# Patient Record
Sex: Female | Born: 1945 | Hispanic: Yes | Smoking: Former smoker
Health system: Southern US, Community
[De-identification: ages and names within clinical notes are randomized; demographics above are authoritative.]

## PROBLEM LIST (undated history)

## (undated) DIAGNOSIS — I509 Heart failure, unspecified: Secondary | ICD-10-CM

## (undated) DIAGNOSIS — I10 Essential (primary) hypertension: Secondary | ICD-10-CM

## (undated) DIAGNOSIS — M1990 Unspecified osteoarthritis, unspecified site: Secondary | ICD-10-CM

## (undated) DIAGNOSIS — E119 Type 2 diabetes mellitus without complications: Secondary | ICD-10-CM

## (undated) DIAGNOSIS — I2510 Atherosclerotic heart disease of native coronary artery without angina pectoris: Secondary | ICD-10-CM

## (undated) DIAGNOSIS — H539 Unspecified visual disturbance: Secondary | ICD-10-CM

## (undated) DIAGNOSIS — D649 Anemia, unspecified: Secondary | ICD-10-CM

## (undated) DIAGNOSIS — H269 Unspecified cataract: Secondary | ICD-10-CM

## (undated) DIAGNOSIS — K219 Gastro-esophageal reflux disease without esophagitis: Secondary | ICD-10-CM

## (undated) DIAGNOSIS — I350 Nonrheumatic aortic (valve) stenosis: Secondary | ICD-10-CM

## (undated) DIAGNOSIS — E7800 Pure hypercholesterolemia, unspecified: Secondary | ICD-10-CM

## (undated) DIAGNOSIS — Z9189 Other specified personal risk factors, not elsewhere classified: Secondary | ICD-10-CM

## (undated) HISTORY — DX: Nonrheumatic aortic (valve) stenosis: I35.0

## (undated) HISTORY — DX: Essential (primary) hypertension: I10

## (undated) HISTORY — DX: Pure hypercholesterolemia, unspecified: E78.00

## (undated) HISTORY — DX: Unspecified osteoarthritis, unspecified site: M19.90

## (undated) HISTORY — DX: Atherosclerotic heart disease of native coronary artery without angina pectoris: I25.10

## (undated) HISTORY — DX: Type 2 diabetes mellitus without complications: E11.9

## (undated) HISTORY — DX: Heart failure, unspecified: I50.9

---

## 2017-07-12 ENCOUNTER — Telehealth: Payer: Self-pay

## 2017-07-12 NOTE — Telephone Encounter (Signed)
Received call from Patient's daughter stating her mother was referred to have a TAVR and she had not been contacted. [facility] Clinic had not received referral; no MR located.      Patient was seen by Dr. Aneta Mins and Dr. Baltazar Apo at [facility].    Called [place] [facility] and spoke with [place]. Requested records. Marcelle Smiling stated records had already been faxed for referral; fax number given was not for [facility].    Received fax of last OV note from Dr. Aneta Mins (05/27/17) which states, "s/p cardiac cath on 05/20/2017 patient was referred to Valve Clinic at discharge."   Spoke with Mindi Junker at [facility] - requested additional records to include echocardiogram, labs, cardiac catheterization report, as well as  patient demographic information.   Medical records requested from [facility] and B. Sales, MD, patient's PCP, for reports/images as indicated for TAVR evaluation.    Called and updated daughter. Advised an [facility] will call to review additional information and coordinate appointments.  Reviewed [facility]Valve Clinic contact information. Daughter verbalized understanding.

## 2017-07-13 ENCOUNTER — Other Ambulatory Visit: Payer: Self-pay

## 2017-07-15 ENCOUNTER — Other Ambulatory Visit: Payer: Self-pay

## 2017-07-15 DIAGNOSIS — I350 Nonrheumatic aortic (valve) stenosis: Secondary | ICD-10-CM

## 2017-07-26 ENCOUNTER — Ambulatory Visit
Admission: RE | Admit: 2017-07-26 | Discharge: 2017-07-26 | Disposition: A | Discharge status: Home / Self Care | Payer: Self-pay | Source: Ambulatory Visit

## 2017-07-26 ENCOUNTER — Other Ambulatory Visit: Payer: Self-pay

## 2017-08-02 ENCOUNTER — Encounter: Payer: Self-pay | Admitting: Surgery

## 2017-08-02 ENCOUNTER — Other Ambulatory Visit: Payer: Self-pay

## 2017-08-02 ENCOUNTER — Telehealth: Payer: Self-pay

## 2017-08-02 NOTE — Telephone Encounter (Signed)
Spoke with Patient's daughter. Confirmed appointment scheduled 08/03/2017 in [facility].

## 2017-08-02 NOTE — Progress Notes (Addendum)
[place] STRUCTURAL HEART CONSULT    Date & Time: 08/03/17  1500  Patient Name: Ashley Mason  Encounter Physician: Alycia Rossetti  Referring Cardiologist: Mafi/ Karsten Ro    Reason for Consultation:   Cardiac surgery consult for TAVR    History:   71 yo female with severe symptomatic AS. She was seen in office by Dr. Karsten Ro on 08/02/17 and referred for surgical consultation for TAVR.  She underwent cardiac cath in July after she presented with chest pain and cath showed nonobstructive CAD and severe AS.  Post cardiac cath she had some visual deficits in her right eye that have persisted.   She lives with her daughter and has symptoms of fatigue, DOE and lightheadedness with exertion.  Her activity level is also somewhat limited by knee pain. She has a left knee replacement and chronic pain in the right knee.    NYHA: Functional Capacity-Class II  Angina I    Echocardiogram  04/30/2017: EF 65%.  Aortic valve peak flow velocity is 4.45 m/s and mean pressure gradient 49.01 mmHg. AVA 0.64 cm2 . Trace mitral regurgitation. Trace tricuspid regurgitation.     Carotid Artery Duplex  07/27/2017: The common carotid arteries appear normal bilaterally. 1-49% stenosis seen in the internal carotid arteries bilaterally. Insignificant stenosis seen in the external carotid arteries bilaterally. Antegrade flow seen in the vertebral arteries bilaterally. The subclavian arteries are within normal limits.    Cardiac Cath: 05/20/17  [facility]   Lv gram not performed,  AVA 0.5 cm2,  PA 35/16,  LAD mid 50% no other significant stenosis.    Renal Function:  Creat 0.5   05/13/17  [facility]    PFT: needs to be scheduled.    STS Risk Score:   Mortality:1.7%   Morbidity: 12.12%    5-meter walk- secs   No Assistive devices used  1st-6.83  2nd-7.05  3rd-8.66  Pt noticeably more fatigued after 3rd walk. Held onto wall for stability while we talked.    Past Medical History:     Past Medical History:   Diagnosis Date   . Aortic stenosis     Echo 06/2017 AVA .064 MG 49   .  Arthritis     knees- takes occasional ibuprophen or Tylenol   . CAD (coronary artery disease)     50% LAD-lesion cath 05/2017   . CHF (congestive heart failure)    . Diabetes type 2, controlled     Glucophage   . HTN (hypertension)     Diovan   . Hypercholesteremia     Lipitor       Past Surgical History:   Procedure Laterality Date   . CARDIAC CATHETERIZATION  05/2017   . COLON RESECTION  1984   . TOTAL KNEE ARTHROPLASTY Left 2017       Family History:   No family history on file.    Social History:     Social History     Social History   . Marital status: Widowed     Spouse name: N/A   . Number of children: N/A   . Years of education: N/A     Social History Main Topics   . Smoking status: Former Games developer   . Smokeless tobacco: Never Used   . Alcohol use None   . Drug use: Unknown   . Sexual activity: Not Asked     Other Topics Concern   . None     Social History Narrative   . None  Allergies:     Allergies   Allergen Reactions   . Sulfa Antibiotics Hives       Medications:   Home Meds:  Current/Home Medications    ACETAMINOPHEN (TYLENOL) 500 MG TABLET    Take 500 mg by mouth.Arthritis in knees    ASPIRIN 81 MG CHEWABLE TABLET    Take 2 Tabs by Mouth Once a Day.    ATORVASTATIN (LIPITOR) 80 MG TABLET    Take 1 Tab by Mouth Once a Day.    CALCIUM PO    Take 1,200 mg by mouth daily.        LOSARTAN (COZAAR) 50 MG TABLET    take 1 tablet by mouth once daily    METFORMIN (GLUCOPHAGE) 500 MG TABLET    Take 1 Tab by Mouth Twice Daily. Resume Sunday!       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 Has loose tooth  on upper right molar- possible infection, no recent dental care.  Heme: denies hx blood transfusions, or DVT's  MS:  Knee pain bilat, had left replacement still with pain     Physical Exam:   Wt 171 lbs  77.6 kg,  Ht 5'1"  154.9  Cm,   Visit Vitals  BP 122/77 (BP Site: Right arm, Patient Position: Sitting)   Pulse 69   Temp 98.3 F (36.8 C) (Oral)   Resp 20   Ht 1.549 m (5\' 1" )   Wt 78.9 kg (174 lb)   SpO2 98%   BMI 32.88 kg/m     General Appearance: WDWN female nad  HEENT:normocephalic, mucous membranes moist and pink, conjunctiva and sclera clear non-icteric, PERRLA, EOMI  Neck:  Supple, carotid 2+=, +transmitted murmur  Chest:clear bilat  Cardiovascular: RRR S1S2 +systolic murmur 3/6  Abdomen: soft nontender  Extremities:no edema , pedal pulses +  Skin: warm and dry no rashes  Musculoskeletal: fROM, no deformity, slow walk  Neuro: a/o c 3 MAE, grossly intact.    Cardiographics:   ekg 08/03/17 NORMAL SINUS RHYTHM  LEFT VENTRICULAR HYPERTROPHY         Labs Reviewed:                             Rads:   Xr Chest 2 Views    Result Date: 08/03/2017    Mild cardiomegaly, hypoventilatory change. Geanie Cooley, MD 08/03/2017 2:49 PM      Assessment:   1. Nonrheumatic aortic valve stenosis    2. 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  Needs PFT  Re-do 5 meter walk- at next visit    Pt was seen by DR. Jamey Ripa, NP  Structural Heart, [facility] Valve Program  Spect  [id number]  Fax [phone]  Office [phone]

## 2017-08-03 ENCOUNTER — Ambulatory Visit
Admission: RE | Admit: 2017-08-03 | Discharge: 2017-08-03 | Disposition: A | Discharge status: Home / Self Care | Payer: Medicare Other | Source: Ambulatory Visit | Attending: Adult Health | Admitting: Adult Health

## 2017-08-03 ENCOUNTER — Ambulatory Visit: Payer: Medicare Other | Admitting: Surgery

## 2017-08-03 VITALS — BP 122/77 | HR 69 | Temp 98.3°F | Resp 20 | Ht 61.0 in | Wt 174.0 lb

## 2017-08-03 DIAGNOSIS — R0689 Other abnormalities of breathing: Secondary | ICD-10-CM | POA: Insufficient documentation

## 2017-08-03 DIAGNOSIS — I517 Cardiomegaly: Secondary | ICD-10-CM | POA: Insufficient documentation

## 2017-08-03 DIAGNOSIS — I350 Nonrheumatic aortic (valve) stenosis: Secondary | ICD-10-CM

## 2017-08-03 DIAGNOSIS — I5032 Chronic diastolic (congestive) heart failure: Secondary | ICD-10-CM

## 2017-08-03 DIAGNOSIS — Z01818 Encounter for other preprocedural examination: Secondary | ICD-10-CM | POA: Insufficient documentation

## 2017-08-03 LAB — ECG 12-LEAD
Atrial Rate: 68 {beats}/min
P Axis: 15 degrees
P-R Interval: 168 ms
Q-T Interval: 394 ms
QRS Duration: 92 ms
QTC Calculation (Bezet): 418 ms
R Axis: -23 degrees
T Axis: 140 degrees
Ventricular Rate: 68 {beats}/min

## 2017-08-03 NOTE — Progress Notes (Signed)
Pt seen with  daughter  for RN/NP TAVR Teaching and Testing.  Information Packet with contact numbers given & Reviewed.    Pt seen by Dr Alycia Rossetti  for TAVR Surgeon consult  KCCQ-12-done    Hand Strength Grip Test-  Average of three attempts   R Hand Dominant--27.0       5-meter walk- secs   No Assistive devices used  1st-6.83  2nd-7.05  3rd-8.66  Pt noticeably more fatigued after 3rd walk. Held onto wall for stability while we talked.    Reviewed remaining tests needed  Provided dental form   Daughter, Charlotte Sanes, will call with date of CT scan once it is scheduled  Copies of AVS given and reviewed with pt   Understanding verbalized.

## 2017-08-03 NOTE — Patient Instructions (Addendum)
Medical Emergencies --Call 911   Structural Heart Program evaluation consists of the following   -NP & RN visit for teaching, assessment, & chart review  -Cardiac Catheterization  -Echocardiogram   -EKG  -Chest Xray  -Carotid Ultrasound-   -Pulmonary Function Test  CTA Chest,  Abdomen, & Pelvis - @ [facility])    Structural Heart Program Visits @ [facility]      You will  have a consult with one TAVR Cardiologist listed below.                Dr. Tawana Scale will  have consults with two TAVR Surgeon's as listed below.                 Dr. Garen Grams                 Dr. Alycia Rossetti       To schedule call [phone] the CT  [address] [place], [place] [address].    Date       Arrival Time      Test Time        - No solid food 2 hours prior to CTA        - Drink clear liquids up to 30 min prior to CTA.                - Do not take Medications containing Metformin AM of CTA.        - Follow instructions from [facility] for post CTA care.        - Bring insurance card & photo ID.         - Bring list of medications.      Please bring the dental form with you to your next visit- or ask dentist to fax the Structural Heart Clinic        Pre Procedure Lab work is performed @ [facility]   [address] [place], [place] [address], Phone # [phone]     Hours of Operation M-F 7am-7Pm.  Saturdays 8-3:45. Closed Sundays & Holidays  This is a walk in service,  & No fasting required.   Lab work appointment will consist of blood & urine samples & possibly an Ekg.   Please allow approximately 1 hour for this appointment      [place] Pre[facility] RN Phone Interview Date & Time      You will be contacted by [facility] Pre-surgical Services to schedule above.  Please allow approximately 1 hour for this appointment.      Procedure Date        Arrival Time - See Below   Our Staff will call you the day prior by 5 pm with your arrival time.   Procedural arrival times are 0500,  0700, 0900, or 1100.   Please register @ Cardiac Cath/EP lab/Cardiac surgery check in.   Ground floor IHVI the day of your procedure.   Please follow the medication list printed for you today.  Please obtain Hibiclens @ [place] [facility] lab visit & follow the instructions for use.   Please fill Bactroban Prescription & follow the instructions as listed below.  Place a Pea sized amt on a Q-tip & apply to the inside of each nostril  Press the sides of the nose together & gently massage after application to spread the ointment throughout the inside of the nostrils.  Structural Heart Program Visits Post Procedure  Structural Heart Program 3-5 day Post Discharge Visit   This appointment will consist of a Nurse Coordinator & Practitioner visit.   Chest Xray, EKG, & lab work may be performed @ this visit.  No fasting or special preparation is needed for this visit.  Report to Cardiac Diagnostics department - 1st floor [facility] for EKG/CXR.  Proceed to Structural Heart Program Ground floor IHVI after above is complete.    Structural Heart Program-- 30 day Post Procedure Visit  Your 30 day post Procedure Range is    This appointment will consist of a Nurse Coordinator & Practitioner visit  Echocardiogram, Ekg, Non- fasting Lab work, & a questionnaire.    Please allow approximately 2 hours for this appointment.    Structural Heart Program --1 year Post Procedure Visit     Your 1 year post Procedure Range is   This appointment will consist of a Nurse Coordinator & Practitioner visit  Echocardiogram, Ekg, Non- fasting Lab work, & a questionnaire.    Please allow approximately 2 hours for this appointment.     For Clinical questions, please call Mon-Fri 9am to 4pm.   Nolon Nations - Nurse Coordinator @ [phone].  Mitzi Hansen - Nurse Coordinator @ [phone]  Remi Haggard - Nurse Coordinator @ [phone]     For Scheduling / Financial questions, please call Mon-Fri 9am to 4pm  Sander Radon - Administrative Coordinator @  [phone]  Janna Arch - Administrative Coordinator @ [phone]

## 2017-08-06 ENCOUNTER — Telehealth: Payer: Self-pay

## 2017-08-06 NOTE — Telephone Encounter (Signed)
Spoke with Pt's daughter.  CT scheduled for Thursday, 08/12/17 @ 0945.  She is scheduling appt for dentist 1st week in October after she sees PCP.      SH/Valve Clinic will call Monday to discuss appt for 2nd surgeon consult and PFT.    Daughter verbalized understanding.

## 2017-08-09 ENCOUNTER — Other Ambulatory Visit: Payer: Self-pay

## 2017-08-11 ENCOUNTER — Ambulatory Visit: Payer: Medicare Other

## 2017-08-11 ENCOUNTER — Institutional Professional Consult (permissible substitution): Payer: Medicare Other | Admitting: Thoracic Surgery (Cardiothoracic Vascular Surgery)

## 2017-08-12 ENCOUNTER — Other Ambulatory Visit: Payer: Self-pay

## 2017-08-12 ENCOUNTER — Ambulatory Visit
Admission: RE | Admit: 2017-08-12 | Discharge: 2017-08-12 | Disposition: A | Discharge status: Home / Self Care | Payer: Self-pay | Source: Ambulatory Visit

## 2017-08-17 ENCOUNTER — Telehealth: Payer: Self-pay

## 2017-08-17 NOTE — Telephone Encounter (Signed)
Patient's daughter called to review appointments scheduled for October 10.  Reviewed type, time and location for each appointment scheduled; understanding verbalized.    Per Daughter, patient has dental appointment scheduled for October 9.

## 2017-08-20 ENCOUNTER — Telehealth: Payer: Self-pay

## 2017-08-20 NOTE — Telephone Encounter (Signed)
LM on daughter's voicemail advising that due to Dr Exie Parody schedule, pt's appt time for 08/25/17 has changed.  Asked that they arrive at 1045am for an 1100 appt.  Provided name and contact # for [facility].

## 2017-08-24 NOTE — Progress Notes (Signed)
[place] STRUCTURAL HEART CONSULT    Date & Time:  08/25/17   1500  Patient Name: Ashley Mason  Encounter Physician: Garen Grams  Referring Cardiologist: Mafi/ Karsten Ro    Reason for Consultation:   2 nd Cardiac surgery consult for TAVR    History:   71 yo female with severe symptomatic AS. She was seen in office by Dr. Karsten Ro on 08/02/17 and referred for surgical consultation for TAVR.  She underwent cardiac cath in July after she presented with chest pain and cath showed nonobstructive CAD and severe AS.  Post cardiac cath she had some visual deficits in her right eye that have persisted.   She lives with her daughter and has symptoms of fatigue, DOE and lightheadedness with exertion.  Her activity level is also limited by knee pain. She has a left knee replacement and chronic pain in the right knee.  She is able to shop but needs to stop and rest frequently due to knee pain and fatigue.  She presents with her daughter today for 2nd cardiac surgery consult    NYHA: Functional Capacity-Class II  Angina I    CTA c/a/p 08/12/17  perim 72, area 406, ang 48 LFA  5.2, RFA 5.2    Echocardiogram  04/30/2017: EF 65%.  Aortic valve peak flow velocity is 4.45 m/s and mean pressure gradient 49.01 mmHg. AVA 0.64 cm2 . Trace mitral regurgitation. Trace tricuspid regurgitation.     Carotid Artery Duplex  07/27/2017: The common carotid arteries appear normal bilaterally. 1-49% stenosis seen in the internal carotid arteries bilaterally. Insignificant stenosis seen in the external carotid arteries bilaterally. Antegrade flow seen in the vertebral arteries bilaterally. The subclavian arteries are within normal limits.    Cardiac Cath: 05/20/17  [facility]   Lv gram not performed,  AVA 0.5 cm2,  PA 35/16,  LAD mid 50% no other significant stenosis.    Renal Function:  Creat 0.5   05/13/17  [facility]    PFT:  08/25/17- report pending    STS Risk Score:   Mortality:1.7%   Morbidity: 12.12%    5-meter walk- secs   No Assistive devices  used  1st-6.83  2nd-7.05  3rd-8.66     Repeated 5 meter walk today  7.01  6.50  8.20 (held on to wall)    Past Medical History:     Past Medical History:   Diagnosis Date   . Aortic stenosis     Echo 06/2017 AVA .064 MG 49   . Arthritis     knees- takes occasional ibuprophen or Tylenol   . CAD (coronary artery disease)     50% LAD-lesion cath 05/2017   . CHF (congestive heart failure)    . Diabetes type 2, controlled     Glucophage   . HTN (hypertension)     Diovan   . Hypercholesteremia     Lipitor       Past Surgical History:   Procedure Laterality Date   . CARDIAC CATHETERIZATION  05/2017   . COLON RESECTION  1984   . TOTAL KNEE ARTHROPLASTY Left 2017       Family History:   No family history on file.    Social History:     Social History     Social History   . Marital status: Widowed     Spouse name: N/A   . Number of children: N/A   . Years of education: N/A     Social History Main Topics   . Smoking  status: Former Games developer   . Smokeless tobacco: Never Used   . Alcohol use Not on file   . Drug use: Unknown   . Sexual activity: Not on file     Other Topics Concern   . Not on file     Social History Narrative   . No narrative on file       Allergies:     Allergies   Allergen Reactions   . Sulfa Antibiotics Hives       Medications:   Home Meds:  Current/Home Medications    ACETAMINOPHEN (TYLENOL) 500 MG TABLET    Take 500 mg by mouth.Arthritis in knees   Last dose day before surgery -if needed        ASPIRIN 81 MG CHEWABLE TABLET    Last dose day of surgery    ATORVASTATIN (LIPITOR) 80 MG TABLET    Take 1 Tab by Mouth Once a Day. May take AM of surgery    CALCIUM PO    Take 1,200 mg by mouth daily.Stop 3 days before surgery -Last dose 09/05/17        LOSARTAN (COZAAR) 50 MG TABLET    take 1 tablet by mouth once daily - Stop 48 hours before surgery - Last dose 09/06/17    METFORMIN (GLUCOPHAGE) 500 MG TABLET    Take 1 Tab by Mouth Twice Daily. 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 Has loose tooth on upper right molar- possible infection, saw dentist last week, has gum disease and will have one tooth pulled  Heme: denies hx blood transfusions, or DVT's  MS:  Knee pain bilat, had left replacement still with pain , limits walking     Physical Exam:   Wt 171 lbs  77.6 kg,  Ht 5'1"  154.9  Cm,   Visit Vitals  BP 131/71 (BP Site: Left arm, Patient Position: Sitting)   Pulse 83   Temp 98.4 F (36.9 C) (Oral)   Resp 20   Ht 1.549 m (5\' 1" )   Wt 78.9 kg (174 lb)   SpO2 98%   BMI 32.88 kg/m     General Appearance: WDWN female nad  HEENT:normocephalic, mucous membranes moist and pink, conjunctiva and sclera clear non-icteric, PERRLA, EOMI  Neck:  Supple, carotid 2+=, +transmitted murmur  Chest:clear bilat  Cardiovascular: RRR S1S2 +systolic murmur 3/6  Abdomen: soft nontender  Extremities: no edema, pedal pulses 2+= bilat  Skin: warm and dry, no rashes, no groin rashes  Musculoskeletal: FROM, no deformity, walks slow, stands from chair slowly needs to push on arms  Neuro: a/o x3, MAE, speech clear , grossly intact.    Cardiographics:   ekg 08/03/17 NORMAL SINUS RHYTHM  LEFT VENTRICULAR HYPERTROPHY         Labs Reviewed:                             Rads:   No results found.    Assessment:  1. Nonrheumatic aortic valve stenosis  - mupirocin (BACTROBAN) 2 % ointment; Apply topically 2 (two) times daily.for 5 days Using q-tip, place pea sized amt into each nostril-squeeze to distribute. (Patient taking differently: Apply topically 2 (two) times daily.Using q-tip,  place pea sized amt into each nostril-squeeze to distribute.  Start using 09/04/17    )  Dispense: 22 g; Refill: 0    2. 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  One tooth to be pulled prior to TAVR    Pt seen by Dr. Bea Laura, NP  Structural Heart, [facility] Valve Program  Spect  [phone]  Fax [phone]  Office [phone]

## 2017-08-25 ENCOUNTER — Ambulatory Visit
Admission: RE | Admit: 2017-08-25 | Discharge: 2017-08-25 | Disposition: A | Discharge status: Home / Self Care | Payer: Medicare Other | Source: Ambulatory Visit | Attending: Adult Health | Admitting: Adult Health

## 2017-08-25 ENCOUNTER — Ambulatory Visit: Payer: Medicare Other | Admitting: Thoracic Surgery (Cardiothoracic Vascular Surgery)

## 2017-08-25 ENCOUNTER — Ambulatory Visit: Payer: Medicare Other

## 2017-08-25 VITALS — BP 131/71 | HR 83 | Temp 98.4°F | Resp 20 | Ht 61.0 in | Wt 174.0 lb

## 2017-08-25 DIAGNOSIS — Z01818 Encounter for other preprocedural examination: Secondary | ICD-10-CM | POA: Insufficient documentation

## 2017-08-25 DIAGNOSIS — I350 Nonrheumatic aortic (valve) stenosis: Secondary | ICD-10-CM

## 2017-08-25 DIAGNOSIS — I5032 Chronic diastolic (congestive) heart failure: Secondary | ICD-10-CM

## 2017-08-25 MED ORDER — MUPIROCIN 2 % EX OINT
TOPICAL_OINTMENT | Freq: Two times a day (BID) | CUTANEOUS | 0 refills | Status: AC
Start: 2017-08-25 — End: 2017-08-30

## 2017-08-25 NOTE — Patient Instructions (Addendum)
Pre Procedure Lab work is performed @ [facility]     Please have these labs drawn/completed between 08/27/17 to 09/01/17    [address] [place], [place] [address], Phone # [phone]     Hours of Operation M-F 7am-7Pm.  Saturdays 8-3:45. Closed Sundays & Holidays  This is a walk in service,  & No fasting required.   Lab work appointment will consist of blood & urine samples & possibly an Ekg.   Please allow approximately 1 hour for this appointment.      [place] Pre[facility] RN Phone Interview Date & Time      You will be contacted by [facility] Pre-surgical Services to schedule above.  Please allow approximately 1 hour for this appointment.      Procedure Date  09/09/17    Arrival Time:   Our Staff will call you the day prior by 5 pm with your arrival time.   Procedural arrival times are 0500, 0700, 0900, or 1100.   Please register @ Cardiac Cath/EP lab/Cardiac surgery check in.   Ground floor IHVI the day of your procedure.     Please follow the medication list printed for you today.  It will explain what medications you should stop, take and when, prior to your TAVR procedure.  Please call the clinic with any questions.  .  Please obtain Hibiclens @ [place] [facility] lab visit & follow the instructions for use included in the bag.     Please fill Bactroban Prescription & follow the instructions as listed below.  Place a Pea sized amt on a Q-tip & apply to the inside of each nostril  Press the sides of the nose together & gently massage after application to spread the ointment throughout the inside of the nostrils.            Structural Heart Program Visits Post Procedure  You will be discharged from the hospital with a post-op appointment to see [place] in the Clinic:    Structural Heart Program 3-5 day Post Discharge Visit   This appointment will consist of a Nurse Coordinator & Practitioner visit.   Chest Xray, EKG, & lab work may be performed @ this visit.  No fasting or special preparation  is needed for this visit.  Report to Cardiac Diagnostics department - 1st floor [facility] for EKG/CXR.  Proceed to Structural Heart Program Ground floor IHVI after above is complete.    Structural Heart Program-- 2nd Post Procedure Visit (21-75 days after procedure)  Your 2nd Procedure Range is    This appointment will consist of a Nurse Coordinator & Practitioner visit,    Echocardiogram, Ekg, Non- fasting Lab work, & a questionnaire.    Please allow approximately 2 hours for this appointment.    Structural Heart Program --  Last Post Procedure Visit  (1 year after procedure)  Your 1 year post Procedure Range is   This appointment will consist of a Nurse Coordinator & Practitioner visit,    Echocardiogram, Ekg, Non- fasting Lab work, & a questionnaire.    Please allow approximately 2 hours for this appointment. nt.     For Clinical questions, please call Mon-Fri 9am to 4pm.   Nolon Nations - Nurse Coordinator @ [phone].  Mitzi Hansen - Nurse Coordinator @ [phone]  Remi Haggard - Nurse Coordinator @ [phone]     For Scheduling / Financial questions, please call Mon-Fri 9am to 4pm  Simone Simpson-Britton -  Administrative Coordinator @ [phone]

## 2017-08-25 NOTE — Progress Notes (Signed)
Pt seen with daughter   for RN/NP TAVR Teaching and Testing.  Information Packet with contact numbers given & Reviewed.    Pt seen by Dr. Garen Grams for TAVR Surgeon consult    Copies of AVS-PSS-BBan-Medication cessation list given and reviewed with pt.   Understanding verbalized.  Patients daughter will call with date of single tooth extraction.

## 2017-08-25 NOTE — Progress Notes (Signed)
08/25/17 1240   Spirometry Pre and/or Post   Procedure Location and Type Diagnostic - Pre Only   Pre FVC 2.27 L   Pre FEV1 1.85 L   Pre FEV1/FVC % (Calculated) 81   Patient follows commands Yes   Cough during exhalation No   Early termination No   Leak No   Variable effort No   2 FVC 150 ml of each other Yes   2 FEV1 150 ml of each other Yes   3 maneuvers performed Yes   Patient Effort Good   Adverse Reactions None   Lung Volumes   Method Nitrogen wash out   Pulmonary diagnosis Pre-Operative Evaluation (V72.82)   Procedure status Completed   Adverse Reactions None   DLCO   Pulmonary diagnosis Pre-Operative Evaluation (V72.82)   Procedure status Completed   Adverse Reactions None   Primary Charges   $ Diffusing Capacity Done? Yes   $ Gas Dilution / Washout Lung Vol Performed? Yes   $ Spirometry Type Performed Simple - CPT 94010   Performing Departments   Diffusing Capacity Performing department formula [id number]   PLAB gas dilution performing department formula [id number]

## 2017-09-02 ENCOUNTER — Telehealth: Payer: Self-pay | Admitting: Adult Health

## 2017-09-02 NOTE — Telephone Encounter (Signed)
Structural heart clinic:    Called pt/ daughter to inform them of transcaval access  TAVR sch 09/09/17  Pt had tooth pulled on 08/31/17  Has not not pre-op labs yet as planned due to transportation problems. Advised to go asap. May not be able to go until 09/06/17.    Adalberto Ill, NP  Structural Heart, [facility] Valve Program  Spect  [address]  Fax [phone]  Office [phone]

## 2017-09-03 ENCOUNTER — Ambulatory Visit: Payer: Medicare Other

## 2017-09-03 DIAGNOSIS — Z01818 Encounter for other preprocedural examination: Secondary | ICD-10-CM

## 2017-09-03 NOTE — Pre-Procedure Instructions (Addendum)
   Probable OSA per daughter, symptomatic.  No sleep study done.   Pt to come to [facility] 09/06/17 w pre-op orders per daughter.  Daughter unsure which orders she has, but States has HgbA1c order.  Aware to fast.  .   Dental clear on chart 08/23/17.   LOV notes cards/cath on chart.

## 2017-09-06 ENCOUNTER — Telehealth: Payer: Self-pay | Admitting: Adult Health

## 2017-09-06 ENCOUNTER — Ambulatory Visit: Payer: Medicare Other

## 2017-09-06 DIAGNOSIS — Z0183 Encounter for blood typing: Secondary | ICD-10-CM | POA: Insufficient documentation

## 2017-09-06 DIAGNOSIS — Z01812 Encounter for preprocedural laboratory examination: Secondary | ICD-10-CM | POA: Insufficient documentation

## 2017-09-06 DIAGNOSIS — I350 Nonrheumatic aortic (valve) stenosis: Secondary | ICD-10-CM | POA: Insufficient documentation

## 2017-09-06 DIAGNOSIS — Z01818 Encounter for other preprocedural examination: Secondary | ICD-10-CM

## 2017-09-06 LAB — COMPREHENSIVE METABOLIC PANEL
ALT: 22 U/L (ref 0–55)
AST (SGOT): 19 U/L (ref 5–34)
Albumin/Globulin Ratio: 1.1 (ref 0.9–2.2)
Albumin: 3.7 g/dL (ref 3.5–5.0)
Alkaline Phosphatase: 111 U/L — ABNORMAL HIGH (ref 37–106)
BUN: 11 mg/dL (ref 7.0–19.0)
Bilirubin, Total: 0.4 mg/dL (ref 0.1–1.2)
CO2: 24 mEq/L (ref 21–29)
Calcium: 9.8 mg/dL (ref 8.5–10.5)
Chloride: 106 mEq/L (ref 100–111)
Creatinine: 0.6 mg/dL (ref 0.4–1.5)
Globulin: 3.3 g/dL (ref 2.0–3.7)
Glucose: 100 mg/dL (ref 70–100)
Potassium: 4 mEq/L (ref 3.5–5.1)
Protein, Total: 7 g/dL (ref 6.0–8.3)
Sodium: 139 mEq/L (ref 136–145)

## 2017-09-06 LAB — CBC AND DIFFERENTIAL
Absolute NRBC: 0 10*3/uL
Basophils Absolute Automated: 0.06 10*3/uL (ref 0.00–0.20)
Basophils Automated: 0.9 %
Eosinophils Absolute Automated: 0.16 10*3/uL (ref 0.00–0.70)
Eosinophils Automated: 2.3 %
Hematocrit: 37.8 % (ref 37.0–47.0)
Hgb: 12.3 g/dL (ref 12.0–16.0)
Immature Granulocytes Absolute: 0.02 10*3/uL
Immature Granulocytes: 0.3 %
Lymphocytes Absolute Automated: 2.06 10*3/uL (ref 0.50–4.40)
Lymphocytes Automated: 29.4 %
MCH: 28.7 pg (ref 28.0–32.0)
MCHC: 32.5 g/dL (ref 32.0–36.0)
MCV: 88.3 fL (ref 80.0–100.0)
MPV: 10.6 fL (ref 9.4–12.3)
Monocytes Absolute Automated: 0.43 10*3/uL (ref 0.00–1.20)
Monocytes: 6.1 %
Neutrophils Absolute: 4.28 10*3/uL (ref 1.80–8.10)
Neutrophils: 61 %
Nucleated RBC: 0 /100 WBC (ref 0.0–1.0)
Platelets: 303 10*3/uL (ref 140–400)
RBC: 4.28 10*6/uL (ref 4.20–5.40)
RDW: 13 % (ref 12–15)
WBC: 7.01 10*3/uL (ref 3.50–10.80)

## 2017-09-06 LAB — URINALYSIS REFLEX TO MICROSCOPIC EXAM - REFLEX TO CULTURE
Blood, UA: NEGATIVE
Glucose, UA: NEGATIVE
Nitrite, UA: NEGATIVE
RBC, UA: 0 /hpf (ref 0–5)
Specific Gravity UA: 1.028 (ref 1.001–1.035)
Urine pH: 5.5 (ref 5.0–8.0)
Urobilinogen, UA: 0.2

## 2017-09-06 LAB — PT AND APTT
PT INR: 1 (ref 0.9–1.1)
PT: 13.1 s (ref 12.6–15.0)
PTT: 32 s (ref 23–37)

## 2017-09-06 LAB — HEMOGLOBIN A1C
Average Estimated Glucose: 148.5 mg/dL
Hemoglobin A1C: 6.8 % — ABNORMAL HIGH (ref 4.6–5.9)

## 2017-09-06 LAB — TYPE AND SCREEN
AB Screen Gel: NEGATIVE
ABO Rh: AB POS

## 2017-09-06 LAB — HEMOLYSIS INDEX: Hemolysis Index: 19 — ABNORMAL HIGH (ref 0–18)

## 2017-09-06 LAB — GFR: EGFR: >60

## 2017-09-06 LAB — COLD AGGLUTININ SCREEN: Cold Screen: NEGATIVE

## 2017-09-06 NOTE — Telephone Encounter (Signed)
Structural heart clinic note    rec'd call from patient's daughter this am reporting her mom still has severe pain in jaw, side of face, radiating to next on side the tooth was pulled last week.  Date of extraction was 08/31/17.  She is treating with tylenol and ibuprofen.  She called dentist and also went to the emergency room yesterday at [facility]. They were told there was nothing wrong with the tooth extraction area and it wsa "sinusitis" no antibiotics given.  Advised pt to see pcp, if pain no better by Wed am, TAVR procedure may be postponed on Thursday. Please keep [place] up to date.    Adalberto Ill, NP  Structural Heart, [facility] Valve Program  Spect  [address]  Fax [phone]  Office [phone]

## 2017-09-06 NOTE — Pre-Procedure Instructions (Signed)
GIVEN CHG

## 2017-09-08 ENCOUNTER — Telehealth: Payer: Self-pay | Admitting: Adult Health

## 2017-09-08 NOTE — Pre-Procedure Instructions (Signed)
   09/06/17 Final UC Results reviewed.

## 2017-09-08 NOTE — Telephone Encounter (Signed)
Structural heart clinic    TAVR date 09/09/17  Pt/ daughter called. Confirmed arrival time 0500 am tomorrow 09/09/17.    Pt has had pain in face/ sinus jaw since tooth pulled last week  Especially when breathing cold air, (see previous note)   Is improving now.  Still using tylenol prn- completely relieves pain.  Was seen in ER 10/21 and by dentist told nothing wrong with tooth socket.   Pt denies fever, nasal congestion, cough or feeling sick like she has a bad cold.  Feels ready to proceed with TAVR tomorrow.    Adalberto Ill, NP  Structural Heart, [facility] Valve Program  Spect  [address]  Fax [phone]  Office [phone]

## 2017-09-09 ENCOUNTER — Inpatient Hospital Stay: Payer: Medicare Other

## 2017-09-09 ENCOUNTER — Inpatient Hospital Stay: Payer: Medicare Other | Admitting: Anesthesiology

## 2017-09-09 ENCOUNTER — Inpatient Hospital Stay
Admission: RE | Admit: 2017-09-09 | Discharge: 2017-09-10 | DRG: 267 | Disposition: A | Discharge status: Home / Self Care | Payer: Medicare Other | Source: Ambulatory Visit | Attending: Thoracic Surgery (Cardiothoracic Vascular Surgery) | Admitting: Thoracic Surgery (Cardiothoracic Vascular Surgery)

## 2017-09-09 ENCOUNTER — Encounter
Admission: RE | Disposition: A | Discharge status: Home / Self Care | Payer: Self-pay | Source: Ambulatory Visit | Attending: Thoracic Surgery (Cardiothoracic Vascular Surgery)

## 2017-09-09 DIAGNOSIS — I359 Nonrheumatic aortic valve disorder, unspecified: Secondary | ICD-10-CM

## 2017-09-09 DIAGNOSIS — I2510 Atherosclerotic heart disease of native coronary artery without angina pectoris: Secondary | ICD-10-CM | POA: Diagnosis present

## 2017-09-09 DIAGNOSIS — Z006 Encounter for examination for normal comparison and control in clinical research program: Secondary | ICD-10-CM

## 2017-09-09 DIAGNOSIS — I510 Cardiac septal defect, acquired: Secondary | ICD-10-CM | POA: Diagnosis present

## 2017-09-09 DIAGNOSIS — Z7984 Long term (current) use of oral hypoglycemic drugs: Secondary | ICD-10-CM

## 2017-09-09 DIAGNOSIS — E785 Hyperlipidemia, unspecified: Secondary | ICD-10-CM | POA: Diagnosis present

## 2017-09-09 DIAGNOSIS — Z96652 Presence of left artificial knee joint: Secondary | ICD-10-CM | POA: Diagnosis present

## 2017-09-09 DIAGNOSIS — Z23 Encounter for immunization: Secondary | ICD-10-CM

## 2017-09-09 DIAGNOSIS — E119 Type 2 diabetes mellitus without complications: Secondary | ICD-10-CM | POA: Diagnosis present

## 2017-09-09 DIAGNOSIS — I5032 Chronic diastolic (congestive) heart failure: Secondary | ICD-10-CM | POA: Diagnosis present

## 2017-09-09 DIAGNOSIS — I350 Nonrheumatic aortic (valve) stenosis: Principal | ICD-10-CM | POA: Diagnosis present

## 2017-09-09 HISTORY — DX: Other specified personal risk factors, not elsewhere classified: Z91.89

## 2017-09-09 HISTORY — DX: Anemia, unspecified: D64.9

## 2017-09-09 HISTORY — DX: Unspecified visual disturbance: H53.9

## 2017-09-09 HISTORY — DX: Gastro-esophageal reflux disease without esophagitis: K21.9

## 2017-09-09 HISTORY — DX: Unspecified cataract: H26.9

## 2017-09-09 LAB — RED BLOOD CELLS OR HOLD
Expiration Date: 201811242359
Expiration Date: 201811242359
UTYPE: A POS
UTYPE: A POS

## 2017-09-09 LAB — CBC
Absolute NRBC: 0 10*3/uL
Absolute NRBC: 0 10*3/uL
Hematocrit: 31.4 % — ABNORMAL LOW (ref 37.0–47.0)
Hematocrit: 32 % — ABNORMAL LOW (ref 37.0–47.0)
Hgb: 10.4 g/dL — ABNORMAL LOW (ref 12.0–16.0)
Hgb: 10.7 g/dL — ABNORMAL LOW (ref 12.0–16.0)
MCH: 29.5 pg (ref 28.0–32.0)
MCH: 29.4 pg (ref 28.0–32.0)
MCHC: 33.1 g/dL (ref 32.0–36.0)
MCHC: 33.4 g/dL (ref 32.0–36.0)
MCV: 89.2 fL (ref 80.0–100.0)
MCV: 87.9 fL (ref 80.0–100.0)
MPV: 9.8 fL (ref 9.4–12.3)
MPV: 9.9 fL (ref 9.4–12.3)
Nucleated RBC: 0 /100 WBC (ref 0.0–1.0)
Nucleated RBC: 0 /100 WBC (ref 0.0–1.0)
Platelets: 282 10*3/uL (ref 140–400)
Platelets: 288 10*3/uL (ref 140–400)
RBC: 3.52 10*6/uL — ABNORMAL LOW (ref 4.20–5.40)
RBC: 3.64 10*6/uL — ABNORMAL LOW (ref 4.20–5.40)
RDW: 13 % (ref 12–15)
RDW: 13 % (ref 12–15)
WBC: 8.38 10*3/uL (ref 3.50–10.80)
WBC: 7.95 10*3/uL (ref 3.50–10.80)

## 2017-09-09 LAB — GLUCOSE WHOLE BLOOD
Whole Blood Glucose: 135 mg/dL — ABNORMAL HIGH (ref 70–100)
Whole Blood Glucose: 148 mg/dL — ABNORMAL HIGH (ref 70–100)

## 2017-09-09 LAB — ABG WITH NA/K/CA IONIZED
Arterial Total CO2: 23.8 mEq/L — ABNORMAL LOW (ref 24.0–30.0)
Arterial Total CO2: 21.2 mEq/L — ABNORMAL LOW (ref 24.0–30.0)
Base Excess, Arterial: -1.7 mEq/L (ref <=2.0)
Base Excess, Arterial: -4.4 mEq/L — ABNORMAL LOW (ref <=2.0)
Calcium, Ionized: 2.48 mEq/L (ref 2.30–2.58)
Calcium, Ionized: 2.48 mEq/L (ref 2.30–2.58)
HCO3, Arterial: 22.6 mEq/L — ABNORMAL LOW (ref 23.0–29.0)
HCO3, Arterial: 20.1 mEq/L — ABNORMAL LOW (ref 23.0–29.0)
O2 Sat, Arterial: 100 % (ref 95.0–100.0)
O2 Sat, Arterial: 99.7 % (ref 95.0–100.0)
Temperature: 37
Temperature: 37
Whole Blood Potassium: 3.5 mEq/L (ref 3.5–5.3)
Whole Blood Potassium: 4.3 mEq/L (ref 3.5–5.3)
Whole Blood Sodium: 142 mEq/L (ref 136–146)
Whole Blood Sodium: 142 mEq/L (ref 136–146)
pCO2, Arterial: 39.2 mmHg (ref 35.0–45.0)
pCO2, Arterial: 36.9 mmHg (ref 35.0–45.0)
pH, Arterial: 7.38 (ref 7.350–7.450)
pH, Arterial: 7.354 (ref 7.350–7.450)
pO2, Arterial: 376 mmHg — ABNORMAL HIGH (ref 80.0–90.0)
pO2, Arterial: 268 mmHg — ABNORMAL HIGH (ref 80.0–90.0)

## 2017-09-09 LAB — TOTAL HEMOGLOBIN GROUP
Hematocrit Total Calculated: 34.9 % — ABNORMAL LOW (ref 37.0–47.0)
Hematocrit Total Calculated: 32.4 % — ABNORMAL LOW (ref 37.0–47.0)
Hemoglobin Total: 11.3 g/dL — ABNORMAL LOW (ref 12.0–16.0)
Hemoglobin Total: 10.5 g/dL — ABNORMAL LOW (ref 12.0–16.0)

## 2017-09-09 LAB — ACTIVATED CLOTTING TIME
ACT POCT: 219 — ABNORMAL HIGH (ref 85–120)
ACT POCT: 136 — ABNORMAL HIGH (ref 85–120)
ACT POCT: 342 — ABNORMAL HIGH (ref 85–120)

## 2017-09-09 LAB — MAGNESIUM
Magnesium: 1.8 mg/dL (ref 1.6–2.6)
Magnesium: 1.9 mg/dL (ref 1.6–2.6)

## 2017-09-09 LAB — BASIC METABOLIC PANEL
BUN: 8 mg/dL (ref 7.0–19.0)
CO2: 22 mEq/L (ref 22–29)
Calcium: 8.5 mg/dL (ref 8.5–10.5)
Chloride: 109 mEq/L (ref 100–111)
Creatinine: 0.6 mg/dL (ref 0.6–1.0)
Glucose: 155 mg/dL — ABNORMAL HIGH (ref 70–100)
Potassium: 4 mEq/L (ref 3.5–5.1)
Sodium: 138 mEq/L (ref 136–145)

## 2017-09-09 LAB — GFR: EGFR: >60

## 2017-09-09 LAB — GLUCOSE WHOLE BLOOD - POCT
Whole Blood Glucose POCT: 128 mg/dL — ABNORMAL HIGH (ref 70–100)
Whole Blood Glucose POCT: 128 mg/dL — ABNORMAL HIGH (ref 70–100)
Whole Blood Glucose POCT: 130 mg/dL — ABNORMAL HIGH (ref 70–100)

## 2017-09-09 LAB — POTASSIUM: Potassium: 3.8 mEq/L (ref 3.5–5.1)

## 2017-09-09 LAB — PT/INR
PT INR: 1.1 (ref 0.9–1.1)
PT: 14.3 s (ref 12.6–15.0)

## 2017-09-09 LAB — APTT: PTT: 35 s (ref 23–37)

## 2017-09-09 SURGERY — PERCUTANEOUS VALVULOPLASTY - AORTIC
Anesthesia: Conscious Sedation

## 2017-09-09 SURGERY — REPLACEMENT, AORTIC VALVE , EDWARDS SAPIEN
Anesthesia: Anesthesia General | Site: Esophagus | Wound class: Clean

## 2017-09-09 MED ORDER — ONDANSETRON 4 MG PO TBDP
4.0000 mg | ORAL_TABLET | Freq: Three times a day (TID) | ORAL | Status: DC | PRN
Start: 2017-09-09 — End: 2017-09-10

## 2017-09-09 MED ORDER — SODIUM CHLORIDE 0.9 % IV SOLN
INTRAVENOUS | Status: DC
Start: 2017-09-09 — End: 2017-09-09

## 2017-09-09 MED ORDER — ALBUTEROL SULFATE HFA 108 (90 BASE) MCG/ACT IN AERS
INHALATION_SPRAY | RESPIRATORY_TRACT | Status: AC
Start: 2017-09-09 — End: ?
  Filled 2017-09-09: qty 1

## 2017-09-09 MED ORDER — INSULIN REGULAR HUMAN 100 UNIT/ML IJ SOLN
1.0000 [IU] | Freq: Three times a day (TID) | INTRAMUSCULAR | Status: DC | PRN
Start: 2017-09-09 — End: 2017-09-10
  Administered 2017-09-10: 12:00:00 1 [IU] via SUBCUTANEOUS

## 2017-09-09 MED ORDER — CHLORHEXIDINE GLUCONATE 0.12 % MT SOLN
15.0000 mL | Freq: Two times a day (BID) | OROMUCOSAL | Status: DC
Start: 2017-09-09 — End: 2017-09-09
  Administered 2017-09-09: 15 mL via OROMUCOSAL
  Filled 2017-09-09: qty 15

## 2017-09-09 MED ORDER — HEPARIN SODIUM (PORCINE) 1000 UNIT/ML IJ SOLN
INTRAMUSCULAR | Status: DC | PRN
Start: 2017-09-09 — End: 2017-09-09
  Administered 2017-09-09: 11000 [IU] via INTRAVENOUS

## 2017-09-09 MED ORDER — LUBRIFRESH P.M. OP OINT
TOPICAL_OINTMENT | OPHTHALMIC | Status: AC
Start: 2017-09-09 — End: ?
  Filled 2017-09-09: qty 3.5

## 2017-09-09 MED ORDER — CLOPIDOGREL BISULFATE 75 MG PO TABS
300.0000 mg | ORAL_TABLET | Freq: Once | ORAL | Status: AC
Start: 2017-09-09 — End: 2017-09-09

## 2017-09-09 MED ORDER — FUROSEMIDE 40 MG PO TABS
40.0000 mg | ORAL_TABLET | Freq: Every day | ORAL | Status: DC
Start: 2017-09-10 — End: 2017-09-10
  Administered 2017-09-10: 11:00:00 40 mg via ORAL
  Filled 2017-09-09: qty 1

## 2017-09-09 MED ORDER — OXYCODONE-ACETAMINOPHEN 5-325 MG PO TABS
1.0000 | ORAL_TABLET | ORAL | Status: DC | PRN
Start: 2017-09-09 — End: 2017-09-09

## 2017-09-09 MED ORDER — PROPOFOL 10 MG/ML IV EMUL (WRAP)
INTRAVENOUS | Status: DC | PRN
Start: 2017-09-09 — End: 2017-09-09
  Administered 2017-09-09: 20 mg via INTRAVENOUS
  Administered 2017-09-09: 40 mg via INTRAVENOUS
  Administered 2017-09-09: 10 mg via INTRAVENOUS
  Administered 2017-09-09: 30 mg via INTRAVENOUS

## 2017-09-09 MED ORDER — ROCURONIUM BROMIDE 50 MG/5ML IV SOLN
INTRAVENOUS | Status: DC | PRN
Start: 2017-09-09 — End: 2017-09-09
  Administered 2017-09-09: 10 mg via INTRAVENOUS
  Administered 2017-09-09: 35 mg via INTRAVENOUS

## 2017-09-09 MED ORDER — ALBUMIN HUMAN 5 % IV SOLN
INTRAVENOUS | Status: DC | PRN
Start: 2017-09-09 — End: 2017-09-09

## 2017-09-09 MED ORDER — INSULIN REGULAR HUMAN 100 UNIT/ML IJ SOLN
1.0000 [IU] | Freq: Every evening | INTRAMUSCULAR | Status: DC | PRN
Start: 2017-09-09 — End: 2017-09-10

## 2017-09-09 MED ORDER — PROPOFOL INFUSION 10 MG/ML
10.0000 ug/kg/min | INTRAVENOUS | Status: DC
Start: 2017-09-09 — End: 2017-09-09

## 2017-09-09 MED ORDER — ALBUMIN HUMAN 5 % IV SOLN
INTRAVENOUS | Status: AC
Start: 2017-09-09 — End: ?
  Filled 2017-09-09: qty 750

## 2017-09-09 MED ORDER — PROPOFOL 10 MG/ML IV EMUL (WRAP)
INTRAVENOUS | Status: AC
Start: 2017-09-09 — End: ?
  Filled 2017-09-09: qty 20

## 2017-09-09 MED ORDER — BISACODYL 10 MG RE SUPP
10.0000 mg | Freq: Every day | RECTAL | Status: DC | PRN
Start: 2017-09-09 — End: 2017-09-10

## 2017-09-09 MED ORDER — GLYCOPYRROLATE 0.2 MG/ML IJ SOLN
INTRAMUSCULAR | Status: DC | PRN
Start: 2017-09-09 — End: 2017-09-09
  Administered 2017-09-09: 5 mg via INTRAVENOUS

## 2017-09-09 MED ORDER — CLOPIDOGREL BISULFATE 75 MG PO TABS
ORAL_TABLET | ORAL | Status: AC
Start: 2017-09-09 — End: 2017-09-09
  Administered 2017-09-09: 06:00:00 300 mg via ORAL
  Filled 2017-09-09: qty 4

## 2017-09-09 MED ORDER — MAGNESIUM SULFATE 50 % IJ SOLN
INTRAMUSCULAR | Status: AC
Start: 2017-09-09 — End: ?
  Filled 2017-09-09: qty 2

## 2017-09-09 MED ORDER — TRAMADOL HCL 50 MG PO TABS
50.0000 mg | ORAL_TABLET | ORAL | Status: DC | PRN
Start: 2017-09-09 — End: 2017-09-10
  Administered 2017-09-10: 50 mg via ORAL
  Filled 2017-09-09: qty 1

## 2017-09-09 MED ORDER — ISOFLURANE IN SOLN
RESPIRATORY_TRACT | Status: AC
Start: 2017-09-09 — End: ?
  Filled 2017-09-09: qty 100

## 2017-09-09 MED ORDER — CEFUROXIME SODIUM 1.5 G IJ/IV SOLR (WRAP)
Status: AC
Start: 2017-09-09 — End: ?
  Filled 2017-09-09: qty 1500

## 2017-09-09 MED ORDER — POTASSIUM CHLORIDE 20 MEQ/50ML IV SOLN
INTRAVENOUS | Status: DC | PRN
Start: 2017-09-09 — End: 2017-09-09
  Administered 2017-09-09: 20 meq via INTRAVENOUS

## 2017-09-09 MED ORDER — ONDANSETRON HCL 4 MG/2ML IJ SOLN
4.0000 mg | Freq: Three times a day (TID) | INTRAMUSCULAR | Status: DC | PRN
Start: 2017-09-09 — End: 2017-09-10
  Administered 2017-09-09: 22:00:00 4 mg via INTRAVENOUS
  Filled 2017-09-09: qty 2

## 2017-09-09 MED ORDER — FUROSEMIDE 10 MG/ML IJ SOLN
20.0000 mg | Freq: Once | INTRAMUSCULAR | Status: AC
Start: 2017-09-09 — End: 2017-09-09
  Administered 2017-09-09: 20 mg via INTRAVENOUS
  Filled 2017-09-09: qty 4

## 2017-09-09 MED ORDER — GLUCOSE 40 % PO GEL
15.0000 g | ORAL | Status: DC | PRN
Start: 2017-09-09 — End: 2017-09-10

## 2017-09-09 MED ORDER — ASPIRIN 81 MG PO TBEC
81.0000 mg | DELAYED_RELEASE_TABLET | Freq: Once | ORAL | Status: DC
Start: 2017-09-09 — End: 2017-09-09

## 2017-09-09 MED ORDER — EPINEPHRINE 10 MCG/ML IV IN NS SYRINGE LOW DOSE (PEDS)
INJECTION | INTRAVENOUS | Status: AC
Start: 2017-09-09 — End: ?
  Filled 2017-09-09: qty 10

## 2017-09-09 MED ORDER — SENNOSIDES-DOCUSATE SODIUM 8.6-50 MG PO TABS
1.0000 | ORAL_TABLET | Freq: Every evening | ORAL | Status: DC
Start: 2017-09-09 — End: 2017-09-10
  Administered 2017-09-09: 21:00:00 1 via ORAL
  Filled 2017-09-09: qty 2

## 2017-09-09 MED ORDER — LACTATED RINGERS IV SOLN
INTRAVENOUS | Status: DC | PRN
Start: 2017-09-09 — End: 2017-09-09

## 2017-09-09 MED ORDER — MAGNESIUM SULFATE 50 % IJ SOLN
INTRAMUSCULAR | Status: DC | PRN
Start: 2017-09-09 — End: 2017-09-09
  Administered 2017-09-09: 1 g via INTRAVENOUS

## 2017-09-09 MED ORDER — ETOMIDATE 2 MG/ML IV SOLN
INTRAVENOUS | Status: AC
Start: 2017-09-09 — End: ?
  Filled 2017-09-09: qty 20

## 2017-09-09 MED ORDER — IODIXANOL 320 MG/ML IV SOLN
93.0000 mL | Freq: Once | INTRAVENOUS | Status: AC | PRN
Start: 2017-09-09 — End: 2017-09-09
  Administered 2017-09-09: 09:00:00 93 mL via INTRA_ARTERIAL

## 2017-09-09 MED ORDER — PHENYLEPHRINE HCL 10 MG/ML IV SOLN (WRAP)
Status: DC | PRN
Start: 2017-09-09 — End: 2017-09-09
  Administered 2017-09-09: 100 ug via INTRAVENOUS
  Administered 2017-09-09 (×3): 50 ug via INTRAVENOUS
  Administered 2017-09-09 (×2): 100 ug via INTRAVENOUS

## 2017-09-09 MED ORDER — SODIUM CHLORIDE 0.9 % IV SOLN
INTRAVENOUS | Status: DC | PRN
Start: 2017-09-09 — End: 2017-09-09

## 2017-09-09 MED ORDER — POTASSIUM CHLORIDE 20 MEQ/50ML IV SOLN
INTRAVENOUS | Status: AC
Start: 2017-09-09 — End: ?
  Filled 2017-09-09: qty 50

## 2017-09-09 MED ORDER — ACETAMINOPHEN 325 MG PO TABS
650.0000 mg | ORAL_TABLET | ORAL | Status: DC | PRN
Start: 2017-09-09 — End: 2017-09-09
  Administered 2017-09-09: 650 mg via ORAL
  Filled 2017-09-09: qty 2

## 2017-09-09 MED ORDER — ACETAMINOPHEN 325 MG PO TABS
650.0000 mg | ORAL_TABLET | ORAL | Status: DC | PRN
Start: 2017-09-09 — End: 2017-09-10
  Administered 2017-09-10: 650 mg via ORAL
  Filled 2017-09-09 (×2): qty 2

## 2017-09-09 MED ORDER — NICARDIPINE IV BOLUS SYRINGE (ANESTHESIA)
INTRAVENOUS | Status: AC
Start: 2017-09-09 — End: ?
  Filled 2017-09-09: qty 10

## 2017-09-09 MED ORDER — ONDANSETRON HCL 4 MG/2ML IJ SOLN
8.0000 mg | Freq: Three times a day (TID) | INTRAMUSCULAR | Status: DC | PRN
Start: 2017-09-09 — End: 2017-09-09

## 2017-09-09 MED ORDER — LIDOCAINE HCL (PF) 2 % IJ SOLN
INTRAMUSCULAR | Status: AC
Start: 2017-09-09 — End: ?
  Filled 2017-09-09: qty 5

## 2017-09-09 MED ORDER — FENTANYL CITRATE (PF) 50 MCG/ML IJ SOLN (WRAP)
INTRAMUSCULAR | Status: DC | PRN
Start: 2017-09-09 — End: 2017-09-09
  Administered 2017-09-09: 50 ug via INTRAVENOUS
  Administered 2017-09-09 (×2): 25 ug via INTRAVENOUS

## 2017-09-09 MED ORDER — HEPARIN WASH BOWL 5 UNITS/ML SOLN (CATH LAB)
Status: AC
Start: 2017-09-09 — End: 2017-09-09
  Filled 2017-09-09: qty 3000

## 2017-09-09 MED ORDER — ONDANSETRON HCL 4 MG/2ML IJ SOLN
INTRAMUSCULAR | Status: AC
Start: 2017-09-09 — End: ?
  Filled 2017-09-09: qty 2

## 2017-09-09 MED ORDER — ROCURONIUM BROMIDE 50 MG/5ML IV SOLN
INTRAVENOUS | Status: AC
Start: 2017-09-09 — End: ?
  Filled 2017-09-09: qty 5

## 2017-09-09 MED ORDER — CEFUROXIME SODIUM 1.5 G IJ/IV SOLR (WRAP)
Status: DC | PRN
Start: 2017-09-09 — End: 2017-09-09
  Administered 2017-09-09: 1.5 g via INTRAVENOUS

## 2017-09-09 MED ORDER — DEXTROSE 50 % IV SOLN
12.5000 g | INTRAVENOUS | Status: DC | PRN
Start: 2017-09-09 — End: 2017-09-10

## 2017-09-09 MED ORDER — ASPIRIN 81 MG PO TBEC
81.0000 mg | DELAYED_RELEASE_TABLET | ORAL | Status: DC
Start: 2017-09-10 — End: 2017-09-10
  Administered 2017-09-10: 10:00:00 81 mg via ORAL

## 2017-09-09 MED ORDER — ALBUTEROL SULFATE HFA 108 (90 BASE) MCG/ACT IN AERS
INHALATION_SPRAY | RESPIRATORY_TRACT | Status: DC | PRN
Start: 2017-09-09 — End: 2017-09-09
  Administered 2017-09-09: 6 via RESPIRATORY_TRACT

## 2017-09-09 MED ORDER — PHENYLEPHRINE 100 MCG/ML IN NACL 0.9% IV SOSY
PREFILLED_SYRINGE | INTRAVENOUS | Status: AC
Start: 2017-09-09 — End: ?
  Filled 2017-09-09: qty 10

## 2017-09-09 MED ORDER — ONDANSETRON 4 MG PO TBDP
4.0000 mg | ORAL_TABLET | Freq: Three times a day (TID) | ORAL | Status: DC | PRN
Start: 2017-09-09 — End: 2017-09-09

## 2017-09-09 MED ORDER — INFLUENZA VAC SPLIT HIGH-DOSE 0.5 ML IM SUSY
0.5000 mL | PREFILLED_SYRINGE | INTRAMUSCULAR | Status: AC | PRN
Start: 2017-09-09 — End: 2017-09-10
  Administered 2017-09-10: 14:00:00 0.5 mL via INTRAMUSCULAR
  Filled 2017-09-09: qty 0.5

## 2017-09-09 MED ORDER — SENNOSIDES-DOCUSATE SODIUM 8.6-50 MG PO TABS
1.0000 | ORAL_TABLET | Freq: Every evening | ORAL | Status: DC
Start: 2017-09-09 — End: 2017-09-09

## 2017-09-09 MED ORDER — ATORVASTATIN CALCIUM 80 MG PO TABS
80.0000 mg | ORAL_TABLET | Freq: Every evening | ORAL | Status: DC
Start: 2017-09-10 — End: 2017-09-10

## 2017-09-09 MED ORDER — NICARDIPINE HCL 2.5 MG/ML IV SOLN
0.0000 mg/h | INTRAVENOUS | Status: DC
Start: 2017-09-09 — End: 2017-09-09
  Filled 2017-09-09: qty 10

## 2017-09-09 MED ORDER — EPHEDRINE SULFATE 50 MG/ML IJ/IV SOLN (WRAP)
Status: AC
Start: 2017-09-09 — End: ?
  Filled 2017-09-09: qty 1

## 2017-09-09 MED ORDER — CLOPIDOGREL BISULFATE 75 MG PO TABS
75.0000 mg | ORAL_TABLET | ORAL | Status: DC
Start: 2017-09-10 — End: 2017-09-10
  Administered 2017-09-10: 10:00:00 75 mg via ORAL
  Filled 2017-09-09: qty 1

## 2017-09-09 MED ORDER — METFORMIN HCL 500 MG PO TABS
500.0000 mg | ORAL_TABLET | Freq: Two times a day (BID) | ORAL | Status: DC
Start: 2017-09-10 — End: 2017-09-10
  Administered 2017-09-10: 08:00:00 500 mg via ORAL
  Filled 2017-09-09: qty 1

## 2017-09-09 MED ORDER — NOREPINEPHRINE 8 MG/100 ML (SIMPLE)
1.0000 ug/min | Status: DC
Start: 2017-09-09 — End: 2017-09-09

## 2017-09-09 MED ORDER — PROTAMINE SULFATE 10 MG/ML IV SOLN
INTRAVENOUS | Status: AC
Start: 2017-09-09 — End: ?
  Filled 2017-09-09: qty 10

## 2017-09-09 MED ORDER — GLYCOPYRROLATE 0.2 MG/ML IJ SOLN
INTRAMUSCULAR | Status: AC
Start: 2017-09-09 — End: ?
  Filled 2017-09-09: qty 1

## 2017-09-09 MED ORDER — BISACODYL 10 MG RE SUPP
10.0000 mg | Freq: Every day | RECTAL | Status: DC | PRN
Start: 2017-09-09 — End: 2017-09-09

## 2017-09-09 MED ORDER — HEPARIN SODIUM (PORCINE) 5000 UNIT/ML IJ SOLN
5000.0000 [IU] | Freq: Three times a day (TID) | INTRAMUSCULAR | Status: DC
Start: 2017-09-09 — End: 2017-09-10
  Administered 2017-09-09 – 2017-09-10 (×3): 5000 [IU] via SUBCUTANEOUS
  Filled 2017-09-09 (×3): qty 1

## 2017-09-09 MED ORDER — PHENYLEPHRINE 100 MCG/ML IN NACL 0.9% IV SOSY
PREFILLED_SYRINGE | INTRAVENOUS | Status: AC
Start: 2017-09-09 — End: ?
  Filled 2017-09-09: qty 5

## 2017-09-09 MED ORDER — GLUCAGON 1 MG IJ SOLR (WRAP)
1.0000 mg | INTRAMUSCULAR | Status: DC | PRN
Start: 2017-09-09 — End: 2017-09-10

## 2017-09-09 MED ORDER — HEPARIN SODIUM (PORCINE) 1000 UNIT/ML IJ SOLN
INTRAMUSCULAR | Status: AC
Start: 2017-09-09 — End: ?
  Filled 2017-09-09: qty 10

## 2017-09-09 MED ORDER — ASPIRIN 81 MG PO TBEC
81.0000 mg | DELAYED_RELEASE_TABLET | ORAL | Status: DC
Start: 2017-09-10 — End: 2017-09-10
  Filled 2017-09-09: qty 1

## 2017-09-09 MED ORDER — NEOSTIGMINE METHYLSULFATE 1 MG/ML IJ/IV SOLN (WRAP)
Status: DC | PRN
Start: 2017-09-09 — End: 2017-09-09
  Administered 2017-09-09: 3 mg via INTRAVENOUS

## 2017-09-09 MED ORDER — ONDANSETRON HCL 4 MG/2ML IJ SOLN
INTRAMUSCULAR | Status: DC | PRN
Start: 2017-09-09 — End: 2017-09-09
  Administered 2017-09-09: 4 mg via INTRAVENOUS

## 2017-09-09 MED ORDER — ETOMIDATE 2 MG/ML IV SOLN
INTRAVENOUS | Status: DC | PRN
Start: 2017-09-09 — End: 2017-09-09
  Administered 2017-09-09: 8 mg via INTRAVENOUS

## 2017-09-09 MED ORDER — LIDOCAINE HCL 2 % IJ SOLN
INTRAMUSCULAR | Status: DC | PRN
Start: 2017-09-09 — End: 2017-09-09
  Administered 2017-09-09: 80 mg

## 2017-09-09 MED ORDER — LACTATED RINGERS IV SOLN
INTRAVENOUS | Status: DC
Start: 2017-09-09 — End: 2017-09-10

## 2017-09-09 MED ORDER — TRAMADOL HCL 50 MG PO TABS
50.0000 mg | ORAL_TABLET | ORAL | Status: DC | PRN
Start: 2017-09-09 — End: 2017-09-09

## 2017-09-09 MED ORDER — MIDAZOLAM HCL 2 MG/2ML IJ SOLN
INTRAMUSCULAR | Status: AC
Start: 2017-09-09 — End: ?
  Filled 2017-09-09: qty 2

## 2017-09-09 MED ORDER — CHLORHEXIDINE GLUCONATE 0.12 % MT SOLN
OROMUCOSAL | Status: AC
Start: 2017-09-09 — End: 2017-09-09
  Administered 2017-09-09: 06:00:00 15 mL via OROMUCOSAL
  Filled 2017-09-09: qty 15

## 2017-09-09 MED ORDER — CHLORHEXIDINE GLUCONATE 0.12 % MT SOLN
15.0000 mL | Freq: Once | OROMUCOSAL | Status: AC
Start: 2017-09-09 — End: 2017-09-09

## 2017-09-09 MED ORDER — NITROGLYCERIN IN D5W 200-5 MCG/ML-% IV SOLN
10.0000 ug/min | INTRAVENOUS | Status: DC
Start: 2017-09-09 — End: 2017-09-09

## 2017-09-09 MED ORDER — PROTAMINE SULFATE 10 MG/ML IV SOLN
INTRAVENOUS | Status: DC | PRN
Start: 2017-09-09 — End: 2017-09-09
  Administered 2017-09-09: 75 mg via INTRAVENOUS

## 2017-09-09 SURGICAL SUPPLY — 50 items
ADHESIVE SKIN CLOSURE DERMABOND MINI .36 (Suture) ×4
ADHESIVE SKIN CLOSURE DERMABOND MINI .36 ML LIQUID APPLICATOR (Suture) ×4 IMPLANT
APPLCATOR CHLORAPREP 26ML (Prep) ×15 IMPLANT
BNDG WEBRIL NONSTRL 4IN (Procedure Accessories) ×1
BOOT SUTURE (Procedure Accessories) ×1
BOOT SUTURE STANDARD ADHESIVE BACK BLOCK RADIOPAQUE STERION FOAM (Procedure Accessories) ×2 IMPLANT
BOOTIE SUTURE STANDARD ADHESIVE BACK (Procedure Accessories) ×2
COVER PROBE SOFT FLEX L96 IN X W6 IN GEL (Procedure Accessories) ×4
COVER PROBE SOFT FLEX L96 IN X W6 IN GEL ULTRASOUND POLYURETHANE (Procedure Accessories) ×4 IMPLANT
DERMABOND MINI (Suture) ×2
DRAPE BILATERAL PERINEAL (Drape) ×1
DRAPE PROBE SOFT 6X96 (Procedure Accessories) ×2
DRAPE SRG CNTRL + SMS CVARTS 84X80IN LF (Drape) ×2
DRAPE SURGICAL IMPERVIOUS UMBILICUS COVER L84 IN X W80 IN CVARTS (Drape) ×2 IMPLANT
DRESSING FM SIL OPTFM 7X7IN LF STRL ADH (Dressing) ×2 IMPLANT
DRESSING SILICONE 7X7IN (Dressing) ×1
DRESSING TEGADERM 4X4X3/4IN (Dressing) ×2
DRESSING TRANSPARENT L4 3/4 IN X W4 IN (Dressing) ×4
DRESSING TRANSPARENT L4 3/4 IN X W4 IN POLYURETHANE ADHESIVE (Dressing) ×4 IMPLANT
ELECTRODE DEFIB QIK COMBO ADLT (Procedure Accessories) ×1
ELECTRODE DEFIBRILLATOR ADULT L24 IN (Procedure Accessories) ×2 IMPLANT
GAUZE SPONGE VERSALON 4PLY 2X2 (Dressing) ×6 IMPLANT
GLOVE ORTHO SIZE 71/2 (Glove) ×1
GLOVE SURGICAL 7 1/2 BIOGEL PI ORTHOPRO (Glove) ×2
GLOVE SURGICAL 7 1/2 BIOGEL PI ORTHOPRO POWDER FREE MICRO ROUGH (Glove) ×2 IMPLANT
GOWN SMARTGOWN XXL XLONG (Gown) ×1
GOWN SRG 2XL XLNG SMARTGOWN LF STRL LVL (Gown) ×2
GOWN SURGICAL 2XL XLONG SMARTGOWN LEVEL 4 RAGLAN SLEEVE BREATHABLE (Gown) ×2 IMPLANT
KIT AOR VLV 23MM CMNDR SAPIEN 3 ATRION (Valve) ×2 IMPLANT
KIT AORTIC VALVE 23 MM TRANSCATHETER INTRODUCER BALLOON CATHETER (Valve) ×2 IMPLANT
KIT TAVR ~~LOC~~ (Kits) ×3 IMPLANT
PADDING CAST L4 YD X W4 IN COHESION HAND TEARABLE SELF BOND SPECIALIST (Procedure Accessories) ×2 IMPLANT
PADDING CST CTTN SPCLST 100 4YDX4IN LF (Procedure Accessories) ×2
SET EXTEN 4ML 30IN LL NONLTX (IV Supply) ×2
SET EXTENSION L29 IN STRAIGHT MALE LUER (IV Supply) ×4
SET EXTENSION L29 IN STRAIGHT MALE LUER LOCK ADAPTER STANDARD BORE (IV Supply) ×4 IMPLANT
SLEEVE CMPR MED KN LGTH KDL SCD 21- IN (Sleeve) ×2
SLEEVE COMPRESSION MEDIUM KNEE LENGTH KENDALL SEQUENTIAL OD21- IN (Sleeve) ×2 IMPLANT
SLEEVE SEQ COMP KNEE REGULAR (Sleeve) ×1
SOL NACL.9% 1000ML IRR NONLTX (Irrigation Solutions) ×2
SOLUTION IRRIGATION 0.9% SODIUM CHLORIDE (Irrigation Solutions) ×4
SOLUTION IRRIGATION 0.9% SODIUM CHLORIDE 1000 ML PLASTIC POUR BOTTLE (Irrigation Solutions) ×4 IMPLANT
STOPCOCK IV 700 PSI 3 WAY HIGH PRESSURE (Other) ×2
STOPCOCK IV 700 PSI 3 WAY HIGH PRESSURE MALE ROTATE LOCK OFF HANDLE (Other) ×2 IMPLANT
STOPCOCK PSI 700 (Other) ×1
SYRINGE LEUR LOK TIP 30 ML (Syringes, Needles) ×3 IMPLANT
TOWEL L26 IN X W17 IN COTTON PREWASH DELINT BLUE ACTISORB DELUXE (Procedure Accessories) ×4 IMPLANT
TOWEL OR STRL DLUX BLUE 10PK (Procedure Accessories) ×2
TOWEL SRG CTTN 26X17IN LF STRL PREWASH (Procedure Accessories) ×4
VALVE KIT SAPIEN 3 23MM (Valve) ×1 IMPLANT

## 2017-09-09 NOTE — Plan of Care (Signed)
Problem: Post-op Phase - Cardiac Surgery  Goal: Effective breathing pattern is maintained  Outcome: Progressing      Comments: Patient weaned to nasal cannula

## 2017-09-09 NOTE — Brief Op Note (Signed)
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 Item Serial No. Manufacturer Lot No. LRB No. Used Action   FDA ZOX#[id number] VLV SAPN3 - [id number] Valve FDA NWG#[id number] VLV SAPN3 [id number] [employer]   N/A 1 Implanted       Drains:   Drains: no    Specimens:   * No specimens in log *      Findings:   No PVL; successful closure of aorto-caval fistula    Complications:   None evident      Signed by: Halford Decamp, MD                                                                           [facility] HEART OR

## 2017-09-09 NOTE — Anesthesia Preprocedure Evaluation (Signed)
Anesthesia Evaluation    AIRWAY    Mallampati: III    TM distance: >3 FB  Neck ROM: full  Mouth Opening:full   CARDIOVASCULAR    cardiovascular exam normal, regular and normal       DENTAL    no notable dental hx     PULMONARY    pulmonary exam normal and clear to auscultation     OTHER FINDINGS              Relevant Problems   No relevant active problems               Anesthesia Plan    ASA 4     general                     intravenous induction   Detailed anesthesia plan: general endotracheal  Monitors/Adjuncts: arterial line    Post Op: ICU  Post op pain management: per surgeon    informed consent obtained    Plan discussed with CRNA.    ECG reviewed  pertinent labs reviewed           ===============================================================  Inpatient Anesthesia Evaluation    Patient Name: ZOXWRU,EAVWUJ WJXBJY  Surgeon: Halford Decamp, MD  Patient Age / Sex: 71 y.o. / female    Medical History:     Past Medical History:   Diagnosis Date   . Abnormal vision     R eye "clot" s/p card cath, 10% vision per daughter   . Anemia     possible per daughter   . Aortic stenosis     Echo 06/2017 AVA .064 MG 49   . Arthritis     knees- takes occasional ibuprophen or Tylenol   . At risk for obstructive sleep apnea     Symptomatic w gasping, sleeps on 2 pillows per daughter. No sleep study done   . Bilateral cataracts     mild   . CAD (coronary artery disease)     50% LAD-lesion cath 05/2017   . CHF (congestive heart failure)     daughter denies knowledge of   . Diabetes type 2, controlled     Metformin, does not take FBS, unsure of last Hgb A1c.   . Gastroesophageal reflux disease     possible, daughter states unsure if she has but occas N/V after eating   . HTN (hypertension)     Diovan   . Hypercholesteremia     Lipitor       Past Surgical History:   Procedure Laterality Date   . CARDIAC CATHETERIZATION  05/2017   . COLON RESECTION  1984   . TOTAL KNEE ARTHROPLASTY Left 2017         Allergies:     Allergies   Allergen  Reactions   . Sulfa Antibiotics Hives         Medications:     No current facility-administered medications for this encounter.      Facility-Administered Medications Ordered in Other Encounters   Medication Dose Route Frequency Last Rate Last Dose   . 0.9%  NaCl infusion    Continuous PRN       . albuterol (PROVENTIL HFA;VENTOLIN HFA) inhaler    PRN   6 puff at 09/09/17 0712   . cefuroxime (ZINACEF) injection   Intravenous PRN   1.5 g at 09/09/17 0729   . etomidate (AMIDATE) injection    PRN   8 mg at 09/09/17 0702   .  fentaNYL (PF) (SUBLIMAZE) injection    PRN   50 mcg at 09/09/17 0729   . lactated ringers infusion    Continuous PRN       . lactated ringers infusion    Continuous PRN       . lidocaine (XYLOCAINE) 2 % injection    PRN   80 mg at 09/09/17 0702   . magnesium sulfate 50 % injection    PRN   1 g at 09/09/17 0731   . phenylephrine (NEO-SYNEPHRINE) injection    PRN   100 mcg at 09/09/17 0741   . potassium chloride 20 mEq in 50 mL IVPB (premix)    PRN   20 mEq at 09/09/17 0733   . propofol (DIPRIVAN) injection    PRN   30 mg at 09/09/17 0707   . rocuronium (ZEMURON) injection    PRN   35 mg at 09/09/17 0703              Prior to Admission medications    Medication Sig Start Date End Date Taking? Authorizing Provider   acetaminophen (TYLENOL) 500 MG tablet Take 500 mg by mouth.Arthritis in knees   Last dose day before surgery -if needed       Yes [provider]   aspirin 81 MG chewable tablet Last dose day of surgery 05/22/17  Yes [provider]   atorvastatin (LIPITOR) 80 MG tablet Take 1 Tab by Mouth Once a Day. May take AM of surgery 05/21/17  Yes [provider]   CALCIUM PO Take 1,200 mg by mouth daily.Stop 3 days before surgery -Last dose 09/05/17       Yes [provider]   losartan (COZAAR) 50 MG tablet take 1 tablet by mouth once daily - Stop 48 hours before surgery - Last dose 09/06/17 07/08/17  Yes [provider]   metFORMIN (GLUCOPHAGE) 500 MG tablet  Take 1 Tab by Mouth Twice Daily. Last dose day before surgery- do not take the day of surgery 05/21/17  Yes [provider]   mupirocin (BACTROBAN) 2 % ointment Apply topically daily.   Yes [provider]     Vitals   Temp:  [36.4 C (97.6 F)] 36.4 C (97.6 F)  Heart Rate:  [79] 79  Resp Rate:  [16] 16  BP: (106-120)/(60-65) 106/65    Wt Readings from Last 3 Encounters:   09/09/17 78.9 kg (174 lb)   08/25/17 78.9 kg (174 lb)   08/03/17 78.9 kg (174 lb)     BMI (Estimated body mass index is 32.88 kg/m as calculated from the following:    Height as of this encounter: 1.549 m (5\' 1" ).    Weight as of this encounter: 78.9 kg (174 lb).)  Temp Readings from Last 3 Encounters:   09/09/17 36.4 C (97.6 F) (Temporal Artery)   08/25/17 36.9 C (98.4 F) (Oral)   08/03/17 36.8 C (98.3 F) (Oral)     BP Readings from Last 3 Encounters:   09/09/17 106/65   08/25/17 131/71   08/03/17 122/77     Pulse Readings from Last 3 Encounters:   09/09/17 79   08/25/17 83   08/03/17 69           Labs:   CBC:  Lab Results   Component Value Date    WBC 7.01 09/06/2017    HGB 12.3 09/06/2017    HCT 37.8 09/06/2017    PLT 303 09/06/2017  Chemistries:  Lab Results   Component Value Date    NA 139 09/06/2017    K 4.0 09/06/2017    CL 106 09/06/2017    CO2 24 09/06/2017    BUN 11.0 09/06/2017    CREAT 0.6 09/06/2017    GLU 100 09/06/2017    HGBA1C 6.8 (H) 09/06/2017    CA 9.8 09/06/2017    AST 19 09/06/2017       Coags:  Lab Results   Component Value Date    PT 13.1 09/06/2017    PTT 32 09/06/2017    INR 1.0 09/06/2017     _____________________      Signed by: Leslie Dales  09/09/17   7:59 AM    =============================================================      Signed by: Leslie Dales 09/09/17 7:59 AM

## 2017-09-09 NOTE — Anesthesia Procedure Notes (Addendum)
FX TEE      Performed by: Leslie Dales    Authorized by: Leslie Dales       Procedure Info    Indication:  Surgeon Request, Valve Function and HD Instability  Surgery Class:  Valve Replacement  Examiner:  Leslie Dales  TEE CPT Codes:  [phone] Adult TEE with interpretation, 93320 Doppler ECHO and 60454 Doppler ECHO (Color Flow)  History of Esophageal Stricture?: No    History of Radiation Therapy?: No    Hemoptysis?: No    Probe Placement:  Bite Block used and Atraumatic  TEE Attestation:  DIAGNOSTIC This TEE is for diagnostic purposes.  Pelase refer to written report for details regarding the exam    Aorta    Asc Aortic Disease:  Focal  Asc Aortic Atheroma:  Grade 2  Desc Aortic Disease:  Focal  Desc Aortic Atheroma:  Grade 2  Aortic Dissection:  None  Coronary Dissection:  None    Pericardium    Pericardial Effusion:  None  Pericardial Thickening:  None    Left Ventricle    LViD Exceeds 9cm: No    LVH-Wall exceeds 1cm: Yes    Ejection Fraction:  Normal  LV Thrombus:  None  LV Septum:  Normal  Pre-surgery RWM Anterior wall:  Normal  Pre-surgery RWM Lateral Wall:  Normal  Pre-surgery RWM Septum:  Normal  Pre-surgery RWM Inferior Wall:  Normal  Pre-surgery RWM Apex:  Normal  Post-surgery RWM Anterior wall:  Normal  Post-surgery RWM Lateral Wall:  Normal  Post-surgery RWM Septum:  Normal  Post-surgery RWM Inferior Wall:  Normal  Post-surgery RWM Apex:  Normal    Left Atrium    Left Atrium Enlargement:  None  Pulmonary Vein Flow:  None  Left Atrial Septum:  Normal  ASD:  None  LA Appendage:  Normal    Right Ventricle    RV Hypertrophy: No    RV Size:  Normal  RV Function:  Normal    Right Atrium    Right Atrium Enlargement: No      Aortic Valve    Native AV Type:  Tricuspid  Native AV:  Leaflet Fusion    AVA Planimetry:  0.7  AV Mean Gradient (mmHg):  17 (difficult to obtain AV LAX images for CW)  AS Grade:  Severe  AR Grade:  None  : TAVR.  Post AV replacement:  No Regurg  Post AVR gradient (mmHg):  3  AV Comments:  Well  seated, no PVL.    Mitral Valve    Native Mitral Valve Type:  Normal  Native Mitral Valve:  Normal    MS Grade:  None  MR Grade:  1+ and 2+    Tricuspid Valve    Native Tricuspid Valve Type:  Normal  Native Tricuspid Valve:  Normal  TR Grade:  1+  Ebstein's Anomaly:  No    Intracardiac Masses

## 2017-09-09 NOTE — Plan of Care (Signed)
Problem: Post-op Phase - Cardiac Surgery  Goal: Cardiac output is adequate  Outcome: Progressing

## 2017-09-09 NOTE — Op Note (Signed)
Date of Procedure: 09/09/2017  Procedures:   1. Trans-cavlalTAVR with a 23mm Edwards SIII via R CFV  2. 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 Non-selective arteriography: aortography  4. Cavography  5. USG guided access of the LCFA and RCFV  6. USG guided access of the RCFV  7. Rad S and I: USG  8. Rad S and I: angiography  CV Surgeon: Lorin Picket, MD  ICElta Guadeloupe, MD  PreopDx: Severe AS  PostopDx: Same  Description: The patient was prepped and draped in the usual sterile fashion. The LCFV was accessed with a micropuncture needle under USG guidance and then cannulated with a long 6 Fr sheath using wire exchange. A TV pacing wire was then advanced into the RV under fluoro and tested for effect. The LCFA was accessed with a Micropuncture needle under USG guidance and then cannulated with a 6 Fr sheath. A pig-tail was then advanced into the infra-renal aorta over a J-wire and our cross-over position was identified through a series of aortograms. TheRCFV was accessed with a Micropuncture needle and was then cannulated with a 6 Fr sheath using microwire exchange. The vessel was pre-closed with two Proglides and was then cannulated with an 8 Fr sheath. A pig-tail was then advanced into the infra-renal IVC and concurrent cavography and aortography were performed to confirm the cross-over position. The aortic pig-tail was exchanged for a goose-neck snare.  An electrified 0.018 wire was then advanced into the infra-renal cava and was positioned orthogonal to the wall of the vessel with an Agilus catheter. The wire was then advanced from the IVC into the aorta and was snared. The wire was advanced into the arch using the snare as support and the caval-aortic track was crossed with a Quick-Cross catheter. The 0.018 wire was then exchanged for an Amplatz wire and an Edwards 14 Fr E-Sheath was advanced from the R CFV into the DTA. The patient was then heparinized.  A JR4catheter was advanced into the root over a J-wire which was exchanged for a Glidewire. The valve was crossed without difficulty and the JR4was exchanged for a pig-tail over a J-wire. Baseline HD were then recorded. The pig-tail was exchanged for an Extra-Stiff Amplatz wire which was carefully nested in the LV apex. An [employer] mounted with a83mm Sapien III bioprosthesis was advanced into the mid-DTA - the valve wasdocked on the balloon under fluoro and the delivery system was advanced into position across the aortic valve. The valve was then deployedwith good effect during rapid pacing. Post-deployment TEE and aortography demonstrated noPVL. The delivery system was exchanged fora pig-tail and post-deployment HD were recorded. Gradients were essentially zero. The LCFA pig-tail was pulled down to the infra-reanal aorta.The L CFV pig-tail was exchanged for a BMW buddy wire and a J-wire, over which the Agilus catheter was advanced into the supra-renal aorta. The E-Sheath was backed off of the Agilus into the IVC and an Amplatz 8 x 6 ASD closure device was deployed across the aorto-caval defect. An aortogram was performed - a controlled [planned] fistula without extravasation was identified. The BMW wire was pulled. The RCFV sheathwas removed and the Proglides were engaged over a J-wire with good effect. The LCFA sheath was removed and the vessel was post-closedwith good effect.There were no evident complications.

## 2017-09-09 NOTE — Transfer of Care (Signed)
Anesthesia Transfer of Care Note    Patient: Ashley Mason    Procedures performed: Procedure(s) with comments:  Replacement, Aortic Valve , Edwards Sapien TAVR Valve:   S3 23mm Access:         Transcaval  TEE IN CVOR - probe [id number]  exam read by Dr.Yang    Anesthesia type: General ETT    Patient location:ICU    Last vitals:   Vitals:    09/09/17 0935   BP: 108/56   Pulse: 90   Resp: 20   Temp:    SpO2: 100%   vss. Report given to CVICU team    Post pain: Patient not complaining of pain, continue current therapy      Mental Status:awake    Respiratory Function: tolerating face mask    Cardiovascular: stable    Nausea/Vomiting: patient not complaining of nausea or vomiting    Hydration Status: adequate    Post assessment: no apparent anesthetic complications and no reportable events    Signed by: Hetty Ely  09/09/17 9:36 AM

## 2017-09-10 ENCOUNTER — Other Ambulatory Visit: Payer: Self-pay

## 2017-09-10 ENCOUNTER — Inpatient Hospital Stay (HOSPITAL_BASED_OUTPATIENT_CLINIC_OR_DEPARTMENT_OTHER): Payer: Medicare Other

## 2017-09-10 ENCOUNTER — Encounter: Payer: Self-pay | Admitting: Surgery

## 2017-09-10 ENCOUNTER — Inpatient Hospital Stay: Payer: Medicare Other

## 2017-09-10 DIAGNOSIS — I350 Nonrheumatic aortic (valve) stenosis: Principal | ICD-10-CM

## 2017-09-10 DIAGNOSIS — Z95811 Presence of heart assist device: Secondary | ICD-10-CM

## 2017-09-10 LAB — ECG 12-LEAD
Atrial Rate: 84 {beats}/min
P Axis: 58 degrees
P-R Interval: 160 ms
Q-T Interval: 382 ms
QRS Duration: 94 ms
QTC Calculation (Bezet): 451 ms
R Axis: -16 degrees
T Axis: 146 degrees
Ventricular Rate: 84 {beats}/min

## 2017-09-10 LAB — CBC
Absolute NRBC: 0 10*3/uL
Hematocrit: 32.6 % — ABNORMAL LOW (ref 37.0–47.0)
Hgb: 10.7 g/dL — ABNORMAL LOW (ref 12.0–16.0)
MCH: 28.9 pg (ref 28.0–32.0)
MCHC: 32.8 g/dL (ref 32.0–36.0)
MCV: 88.1 fL (ref 80.0–100.0)
MPV: 10.2 fL (ref 9.4–12.3)
Nucleated RBC: 0 /100 WBC (ref 0.0–1.0)
Platelets: 296 10*3/uL (ref 140–400)
RBC: 3.7 10*6/uL — ABNORMAL LOW (ref 4.20–5.40)
RDW: 13 % (ref 12–15)
WBC: 6.85 10*3/uL (ref 3.50–10.80)

## 2017-09-10 LAB — GLUCOSE WHOLE BLOOD - POCT
Whole Blood Glucose POCT: 128 mg/dL — ABNORMAL HIGH (ref 70–100)
Whole Blood Glucose POCT: 153 mg/dL — ABNORMAL HIGH (ref 70–100)

## 2017-09-10 LAB — BASIC METABOLIC PANEL
BUN: 8 mg/dL (ref 7.0–19.0)
CO2: 25 mEq/L (ref 22–29)
Calcium: 8.8 mg/dL (ref 8.5–10.5)
Chloride: 107 mEq/L (ref 100–111)
Creatinine: 0.6 mg/dL (ref 0.6–1.0)
Glucose: 138 mg/dL — ABNORMAL HIGH (ref 70–100)
Potassium: 3.7 mEq/L (ref 3.5–5.1)
Sodium: 140 mEq/L (ref 136–145)

## 2017-09-10 LAB — MAGNESIUM: Magnesium: 1.9 mg/dL (ref 1.6–2.6)

## 2017-09-10 LAB — GFR: EGFR: >60

## 2017-09-10 MED ORDER — SENNOSIDES-DOCUSATE SODIUM 8.6-50 MG PO TABS
1.0000 | ORAL_TABLET | Freq: Every evening | ORAL | Status: DC | PRN
Start: 2017-09-10 — End: 2017-09-30

## 2017-09-10 MED ORDER — TRAMADOL HCL 50 MG PO TABS
50.0000 mg | ORAL_TABLET | Freq: Four times a day (QID) | ORAL | 0 refills | Status: DC | PRN
Start: 2017-09-10 — End: 2017-09-30

## 2017-09-10 MED ORDER — CLOPIDOGREL BISULFATE 75 MG PO TABS
75.0000 mg | ORAL_TABLET | Freq: Every day | ORAL | 0 refills | Status: AC
Start: 2017-09-10 — End: ?

## 2017-09-10 MED ORDER — FUROSEMIDE 40 MG PO TABS
40.0000 mg | ORAL_TABLET | Freq: Every day | ORAL | 0 refills | Status: DC
Start: 2017-09-11 — End: 2017-09-30

## 2017-09-10 MED ORDER — ACETAMINOPHEN 325 MG PO TABS
650.0000 mg | ORAL_TABLET | Freq: Four times a day (QID) | ORAL | Status: DC | PRN
Start: 2017-09-10 — End: 2017-09-30

## 2017-09-10 MED ORDER — CLOPIDOGREL BISULFATE 75 MG PO TABS
75.0000 mg | ORAL_TABLET | Freq: Every day | ORAL | 0 refills | Status: DC
Start: 2017-09-10 — End: 2017-09-10
  Filled 2017-09-10: qty 90, 90d supply, fill #0

## 2017-09-10 MED ORDER — FUROSEMIDE 40 MG PO TABS
40.0000 mg | ORAL_TABLET | Freq: Every day | ORAL | 0 refills | Status: DC
Start: 2017-09-11 — End: 2017-09-10
  Filled 2017-09-10: qty 14, 14d supply, fill #0

## 2017-09-10 NOTE — Anesthesia Postprocedure Evaluation (Signed)
Anesthesia Post Evaluation    Patient: Ashley Mason    Procedure(s) with comments:  Replacement, Aortic Valve , Edwards Sapien TAVR Valve:   S3 23mm Access:         Transcaval  TEE IN CVOR - probe #:[id number]  exam read by Dr.Josi Roediger    Anesthesia type: General ETT    Last Vitals:   Vitals:    09/10/17 1116   BP: 141/65   Pulse: 86   Resp: 16   Temp: 36.8 C (98.2 F)   SpO2: 92%       Patient Location: Phase I PACU      Post Pain: Patient not complaining of pain, continue current therapy    Mental Status: awake    Respiratory Function: tolerating face mask    Cardiovascular: stable    Nausea/Vomiting: patient not complaining of nausea or vomiting    Hydration Status: adequate    Post Assessment: no apparent anesthetic complications, no reportable events and no evidence of recall          Anesthesia Qualified Clinical Data Registry 2018    PACU Reintubation  Did the Patient have general anesthesia with intubation: Yes  Did the Patient require reintubation in the PACU?: No  Was this a planned exubation trial (documented in the medical record)?: No    PONV Adult  Is the patient aged 8 or older: Yes  Did the patient receive recieve a general anesthestic: Yes  Does the patient have 3 or more risk factors for PONV? No        PONV Pediatric  Is the patient aged 8-17? No            PACU Transfer Checklist Protocol  Was the patient transferred to the PACU at the conclusion of surgery? Yes  Was a checklist or transfer protocol used? Yes    ICU Transfer Checklist Protocol  Was the patient transferred to the ICU at the conclusion of surgery? No      Post-op Pain Assessment Prior to Anesthesia Care End  Age >=18 and assessed for pain in PACU: Yes  Pacu pain score <7/10: Yes      Perioperative Mortality  Perioperative mortality prior to Anesthesia end time: No    Perioperative Cardiac Arrest  Did the patient have an unanticipated intraoperative cardiac arrest between anesthesia start time and anesthesia end time?  No    Unplanned Admission to ICU  Did the patient have an unplanned admission to the ICU (not initially anticipated at anesthesia start time)? No      Signed by: Leslie Dales, 09/10/2017 3:07 PM

## 2017-09-10 NOTE — Discharge Instr - AVS First Page (Signed)
SEE ACCOMPANYING PINK FORM IN DISCHARGE PACKET - [facility] STRUCTURAL HEART DISCHARGE INSTRUCTIONS                                                     MY CARDIAC SURGERY        The operation I had was : Transcatheter Aortic Valve Replacement        My Incisions are located: both groins        I needed this operation to: replace the narrowed (stenotic) aortic valve        My cardiac surgeon is: Dr. Lorin Picket        My ejection fraction is: 65% (a measure of how well my heart pumps - normal is 55-60%)

## 2017-09-10 NOTE — Progress Notes (Signed)
09/10/17 1208   Patient Type   Within 30 Days of Previous Admission? No   Healthcare Decisions   Interviewed: Patient   English as a second language teacher Information: [phone]   Orientation/Decision Making Abilities of Patient Alert and Oriented x3, able to make decisions   Advance Directive Patient does not have advance directive   Advance Directive not in Chart Copy requested from family/decision maker   Healthcare Agent Appointed No   Healthcare Agent's Name n/a   Healthcare Agent's Phone Number n/a   Prior to admission   Prior level of function Independent with ADLs   Type of Residence Private residence   Home Layout 1/2 bath on main level   Have running water, electricity, heat, etc? Yes   Living Arrangements Children   How do you get to your MD appointments? daughter/self   How do you get your groceries? daughter/self   Who fixes your meals? daughter/self   Who does your laundry? daughter/self   Who picks up your prescriptions? daughter/self   Dressing Independent   Grooming Independent   Feeding Independent   Bathing Independent   Toileting Independent   DME Currently at Home (na)   Name of Prior Assisted Living Facility na   Home Care/Community Services None   Prior SNF admission? (Detail) n/a   Prior Rehab admission? (Detail) n/a   Adult Protective Services (APS) involved? No   Discharge Planning   Support Systems Children   Patient expects to be discharged to: home    Anticipated [place] plan discussed with: Same as interviewed   Mode of transportation: Private car (family member)   Consults/Providers   PT Evaluation Needed 2   OT Evalulation Needed 2   SLP Evaluation Needed 2   Outcome Palliative Care Screen Screened but did not meet criteria for intervention   Correct PCP listed in Epic? Yes   Important Message from [facility] Notice   Patient received 1st IMM Letter? Yes   Date of most recent IMM given: 09/09/17       Melvenia Beam RN, BSN, CCRN  RN Case Manager   Ashley Mason   [phone]

## 2017-09-10 NOTE — UM Notes (Signed)
Admit to inpatient status on 09/09/17 @ 0916    Patient is a 71 year old female who presents for planned procedure.    10/25 Operative Procedure:  Replacement, Aortic Valve , Edwards Sapien TAVR Valve:   S3 23mm Access:   Transcaval  TEE IN CVOR  Findings:  No PVL; successful closure of aorto-caval fistula  Complications:  None evident    10/25 Admit to CVICU  Cardiac tele  NS IV @ 45ml/hr  Tramadol 50mg  po q4hr prn  ADAT  Echo  EKG 12 lead          Lloyd Huger, BSN, RN, ACM          Utilization Review Case Manager  [employer]  ph: [phone]

## 2017-09-10 NOTE — Progress Notes (Signed)
CARDIAC SURGERY CVSD/CTU PROGRESS NOTE    POD: 1    SURGERY:  1. TAVR    EF: 65%    SURGEON: Christene Lye, MD      ASSESSMENT AND PLAN:  Active Problems:    AS (aortic stenosis)      NEURO:   Intact   Ambulating in halls independently    CARDIAC:   Severe AS--sp TAVR   Chronic diastolic HF--should improve post TAVR   BP controlled on no meds   SR 80-100   HLD--Lipitor   Core Measures: ASA: yes; Beta blocker: na; ACE: na; Statin: yes    PULMONARY:   Stable on RA   Chest xray: pending        GI:   Tolerating PO    RENAL:   Normal function   Home on pre op Lasix      HEME:   Hematocrit stable at 33, Platelets 296   ASA/Plavix for TAVR   DVT Prophylaxis: SCDs/SQ Heparin    ID:   Afebrile, no issues     ENDO:    DM--restarted pre op Metformin    PLAN:   Follow up TAVR echo today   Follow up CXR   Ambulate   Home today if studies ok    DISPOSITION:    Home, likely today        SUBJECTIVE:     Sitting in chair, no complaints    OBJECTIVE:    VITALS LAST 24 HOURS:  Temp:  [98 F (36.7 C)-99.1 F (37.3 C)] 98.7 F (37.1 C)  Heart Rate:  [82-105] 83  Resp Rate:  [6-24] 19  BP: (94-125)/(50-67) 94/55  Arterial Line BP: (92-310)/(53-310) 92/54      PHYSICAL EXAM:     Neuro: awake and alert   Cardiac: RRR   Lungs:  clear   Abdomen: soft, non tender, +BS   Extremities: warm, no edema   Incisions: groin sites dry, soft    RECENT LABS:    CBC:  Recent Labs      09/10/17   0412  09/09/17   1550  09/09/17   0956   WBC  6.85  7.95  8.38   Hgb  10.7*  10.7*  10.4*   Hematocrit  32.6*  32.0*  31.4*       BMP:  Recent Labs      09/10/17   0412  09/09/17   1550  09/09/17   0956   Sodium  140   --   138   Potassium  3.7  3.8  4.0   Chloride  107   --   109   CO2  25   --   22   BUN  8.0   --   8.0   Creatinine  0.6   --   0.6   Glucose  138*   --   155*   Magnesium  1.9  1.9  1.8   Calcium  8.8   --   8.5       LFTs:  No results for input(s): AST, ALT, ALKPHOS, BILITOTAL, BILIDIRECT, BILIINDIRECT, PROT, ALB in  the last 72 hours.    INR:  Recent Labs      09/09/17   0956   PT  14.3   PT INR  1.1         MEDICATIONS:    Current Facility-Administered Medications   Medication Dose Route Frequency   . aspirin EC  81  mg Oral Q24H SCH   . aspirin EC  81 mg Oral Q24H SCH   . atorvastatin  80 mg Oral QHS   . clopidogrel  75 mg Oral Q24H SCH   . furosemide  40 mg Oral Daily   . heparin (porcine)  5,000 Units Subcutaneous Q8H SCH   . metFORMIN  500 mg Oral BID Meals   . senna-docusate  1 tablet Oral QHS         INTAKE/OUTPUT:     Intake and Output Summary (Last 24 hours) at Date Time        Intake/Output Summary (Last 24 hours) at 09/10/17 0940  Last data filed at 09/10/17 0454   Gross per 24 hour   Intake           1527.5 ml   Output             2410 ml   Net           -882.5 ml                    Cammy Brochure, PA-C

## 2017-09-10 NOTE — Discharge Summary -  Nursing (Signed)
Reviewed with pt and family discharge instructions. Diet, follow up appointments with physicians, prescriptions and their common side effects, medication administration guide including when the next dose of medication due,  activity restrictions. Medication reconciliation was performed and updated in computer. Also reviewed with pt when to seek further treatment , when to go ER vs. Calling 911. PIV's removed. TELE d/c order sent. Ride at bedside. Belongings gathered. Will request transport escort shortly.

## 2017-09-10 NOTE — Progress Notes (Signed)
Pt A & O x 4. On accuchecks. Afebrile. VSS. SBP: 120-140s. Normal sinus on TELE. HR: 80's. On RA with adequate oxygenation. On [facility] diet and tolerating. +BM 10/24; +bowel sounds; +flatus and belching. Voiding and ambulating well. Groin sites CDI. No hematoma. Palpable distal pulses. OOB with minimal assist. 2 patent, unexpired PIV in place. Denies pain. Call bell, belongings and room phone within reach. Safety maintained. Fall mat in place. Will continue to monitor with hourly rounds.

## 2017-09-10 NOTE — Discharge Summary (Signed)
DISCHARGE NOTE    Date Time: 09/10/17 4:33 PM  Patient Name: Ashley Mason  Attending Physician: Halford Decamp, MD    Date of Admission:   09/09/2017    Date of Discharge:   09/10/17      Reason for Admission:   Aortic valve stenosis, etiology of cardiac valve disease unspecified [I35.0]  Aortic valve disorder [I35.9]    Discharge Dx:   Severe AS - s/p TAVR  CAD with 50% LAD stenosis  Chronic diastolic heart failure  Diabetes  HTN  HLD    Consultations:   none    Procedures performed:   TAVR 23 mm Sapien S III, trans-caval apprpach by Dr Alycia Rossetti on 09/09/17    Hospital Course:   The patient tolerated the procedure and was transferred to the ICU for recovery simply because of the trans-caval approach.  She was given Lasix for mild volume overload.  She was transferred to the step down unit the evening of the surgery.  On the step down unit she did well.  She was started on ASA and Plavix for the valve.  She was diuresed again with Lasix.  Post op echo was performed and showed a normal EF, well seated aortic valve, no AI and no pericardial effusion. She has been ambulating in the hallway and tolerating a PO diet.  Today she was seen and felt stable for discharge home.  Her POD 1 discharge was cleared with Dr Alycia Rossetti     Discharge Medications:     Discharge Medication List as of 09/10/2017  1:58 PM      START taking these medications    Details   senna-docusate (PERICOLACE) 8.6-50 MG per tablet Take 1 tablet by mouth nightly as needed for Constipation., Starting Fri 09/10/2017, OTC      traMADol (ULTRAM) 50 MG tablet Take 1 tablet (50 mg total) by mouth every 6 (six) hours as needed for Pain., Starting Fri 09/10/2017, Print      clopidogrel (PLAVIX) 75 mg tablet Take 1 tablet (75 mg total) by mouth daily., Starting Fri 09/10/2017, Normal      furosemide (LASIX) 40 MG tablet Take 1 tablet (40 mg total) by mouth daily., Starting Sat 09/11/2017, Normal         CONTINUE these medications which have CHANGED    Details    acetaminophen (TYLENOL) 325 MG tablet Take 2 tablets (650 mg total) by mouth every 6 (six) hours as needed for Pain., Starting Fri 09/10/2017, OTC         CONTINUE these medications which have NOT CHANGED    Details   aspirin 81 MG chewable tablet Last dose day of surgery, Historical Med      atorvastatin (LIPITOR) 80 MG tablet Take 1 Tab by Mouth Once a Day. May take AM of surgery, Historical Med      CALCIUM PO Take 1,200 mg by mouth daily.Stop 3 days before surgery -Last dose 09/05/17    , Historical Med      metFORMIN (GLUCOPHAGE) 500 MG tablet Take 1 Tab by Mouth Twice Daily. Last dose day before surgery- do not take the day of surgery, Historical Med         STOP taking these medications       losartan (COZAAR) 50 MG tablet        mupirocin (BACTROBAN) 2 % ointment                Discharge Instructions:   Discharge home  Follow up with the structural heart as directed  Follow up with PCP and cardiology as directed  Remaining instructions per AVS      Signed by: Antionette Char, PA-C

## 2017-09-10 NOTE — Plan of Care (Addendum)
Problem: Moderate/High Fall Risk Score >5  Goal: Patient will remain free of falls  Outcome: Progressing   09/09/17 2200   OTHER   Moderate Risk (6-13) MOD-Initiate Yellow "Fall Risk" magnet communication tool;MOD-(VH Only) Yellow "Fall Risk" signage;MOD-(VH Only) Yellow slippers;MOD-Remain with patient during toileting       Problem: Pain  Goal: Pain at adequate level as identified by patient  Outcome: Progressing   09/10/17 0248   Goal/Interventions addressed this shift   Pain at adequate level as identified by patient Identify patient comfort function goal;Assess for risk of opioid induced respiratory depression, including snoring/sleep apnea. Alert healthcare team of risk factors identified.;Evaluate patient's satisfaction with pain management progress;Reassess pain within 30-60 minutes of any procedure/intervention, per Pain Assessment, Intervention, Reassessment (AIR) Cycle;Evaluate if patient comfort function goal is met       Problem: Compromised Tissue integrity  Goal: Damaged tissue is healing and protected  Outcome: Progressing   09/10/17 0248   Goal/Interventions addressed this shift   Damaged tissue is healing and protected  Monitor/assess Braden scale every shift;Provide wound care per wound care algorithm;Increase activity as tolerated/progressive mobility;Reposition patient every 2 hours and as needed unless able to reposition self;Keep intact skin clean and dry;Relieve pressure to bony prominences for patients at moderate and high risk;Avoid shearing injuries;Consult/collaborate with wound care nurse       Comments: Pt. Transferred from CVICU at 21:00. AOx4.  VSS. Afebrile. SBP 100s-115s. SR-ST, 80s-110s. On RA. Tolerating diet, foley removed at midnight DTV by 4 am, BS every 2 hours, last BS at 6am 178 cc. + flatus, LBM 10/24. OOB x2 person assist. Bilateral groin sites CDI, covered with gauze and tape, soft, non tender, no hematoma noted. + doppler signals. Pain is managed with tylenol and  tramadol. Flu shot in back med room. Safety precautions in place, call bell within reach. Will continue to monitor.

## 2017-09-13 ENCOUNTER — Ambulatory Visit
Admission: RE | Admit: 2017-09-13 | Discharge: 2017-09-13 | Disposition: A | Discharge status: Home / Self Care | Payer: Medicare Other | Source: Ambulatory Visit | Attending: Adult Health | Admitting: Adult Health

## 2017-09-13 ENCOUNTER — Other Ambulatory Visit: Payer: Self-pay

## 2017-09-13 ENCOUNTER — Ambulatory Visit (HOSPITAL_BASED_OUTPATIENT_CLINIC_OR_DEPARTMENT_OTHER): Payer: Medicare Other | Admitting: Adult Health

## 2017-09-13 ENCOUNTER — Encounter: Payer: Self-pay | Admitting: Cardiovascular Disease

## 2017-09-13 VITALS — BP 107/73 | HR 83 | Temp 98.4°F | Resp 20 | Wt 166.0 lb

## 2017-09-13 DIAGNOSIS — Z48812 Encounter for surgical aftercare following surgery on the circulatory system: Secondary | ICD-10-CM | POA: Insufficient documentation

## 2017-09-13 DIAGNOSIS — Z953 Presence of xenogenic heart valve: Secondary | ICD-10-CM

## 2017-09-13 LAB — ECG 12-LEAD
Atrial Rate: 78 {beats}/min
P Axis: 15 degrees
P-R Interval: 180 ms
Q-T Interval: 380 ms
QRS Duration: 96 ms
QTC Calculation (Bezet): 433 ms
R Axis: -25 degrees
T Axis: 145 degrees
Ventricular Rate: 78 {beats}/min

## 2017-09-13 NOTE — Progress Notes (Signed)
[place] STRUCTURAL HEART PROGRAM - PROGRESS NOTE      Date & Time: 09/13/2017 4:25 PM  Patient Name: Ashley Mason      Subjective:post op s/p TAVR S3 #23 via transcaval approach by Dr. Alycia Rossetti on 09/09/17.  D/c home  09/10/17. plavix was started per protocol.    She presents with daughter. She reports improvement in SOB when climbing stairs. She continues to have discomfort in left upper jaw where tooth was pulled one week pre-op but this is improving- slowly. She denies chest pain, dizziness, or pain at groin site.      Current Outpatient Prescriptions:   .  aspirin 81 MG chewable tablet, Last dose day of surgery, Disp: , Rfl:   .  atorvastatin (LIPITOR) 80 MG tablet, Take 1 Tab by Mouth Once a Day., Disp: , Rfl:   .  CALCIUM PO, Take 1,200 mg by mouth daily.  , Disp: , Rfl:   .  clopidogrel (PLAVIX) 75 mg tablet, Take 1 tablet (75 mg total) by mouth daily., Disp: 90 tablet, Rfl: 0  .  furosemide (LASIX) 40 MG tablet, Take 1 tablet (40 mg total) by mouth daily., Disp: 14 tablet, Rfl: 0  .  metFORMIN (GLUCOPHAGE) 500 MG tablet, Take 1 Tab by Mouth Twice Daily., Disp: , Rfl:   .  senna-docusate (PERICOLACE) 8.6-50 MG per tablet, Take 1 tablet by mouth nightly as needed for Constipation., Disp: , Rfl:   .  traMADol (ULTRAM) 50 MG tablet, Take 1 tablet (50 mg total) by mouth every 6 (six) hours as needed for Pain., Disp: 10 tablet, Rfl: 0  .  acetaminophen (TYLENOL) 325 MG tablet, Take 2 tablets (650 mg total) by mouth every 6 (six) hours as needed for Pain., Disp: , Rfl:       Objective:      Physical Exam:       Vitals:    09/13/17 1349   BP: 107/73   BP Site: Right arm   Patient Position: Sitting   Pulse: 83   Resp: 20   Temp: 98.4 F (36.9 C)   TempSrc: Oral   SpO2: 97%   Weight: 75.3 kg (166 lb)       Physical Exam    General: wdwn female NAD    Cardiovascular: RRR S1S2  No murmur    Lungs:clear bilat    Abdomen:soft nontender Bs+    Extremities: no edema, groin sites healing, no bruising, no redness or  swelling    Lab Review:         09/10/17   0412  09/09/17   1550  09/09/17   0956   WBC  6.85  7.95  8.38   Hgb  10.7*  10.7*  10.4*   Hematocrit  32.6*  32.0*  31.4*   Platelets  296  288  282                09/10/17   0412  09/09/17   1550  09/09/17   0956   Sodium  140   --   138   Potassium  3.7  3.8  4.0   CO2  25   --   22            09/10/17   0412  09/09/17   1550  09/09/17   0956   BUN  8.0   --   8.0   Creatinine  0.6   --   0.6   Glucose  138*   --   155*   Magnesium  1.9  1.9  1.8            09/09/17   0956   PT INR  1.1           ECHO:post op 09/10/17 Summary    * Left ventricular systolic function is normal with an ejection fraction by  Biplane Method of Discs of  68 %.    * There is mild concentric left ventricular hypertrophy.    * Left ventricular segmental wall motion is normal.    * Left Ventricular diastolic filling parameters demonstrate normal diastolic  function.    * Normal right ventricular systolic function.    * There is a 29mm ES3 TAVR in the aortic position. This appears well seated  without abnormal motion. The leaflets are not well visualized but appear to  open well. There is no significant AI. The mean gradient is , the DI is  0.6, and the AVA is 1.6cm2. The TAVR does not interfere with mitral valve  function. Overall this suggests normal valve function.  Pericardium / Pleural Effusion    No pericardial effusion visualized.    EKG  09/13/17  NORMAL SINUS RHYTHM  LEFT VENTRICULAR HYPERTROPHY WITH REPOLARIZATION           Assessment/Plan:    1. S/p TAVR (transcatheter aortic valve replacement), bioprosthetic  continue current meds, plavix for 3 months post TAVR, asa lifelong  F/u with primary cardiology next 1-2 weeks  F/u valve clinic 1 month with echo  Cardiac rehab recommended.      NYHA:1    ANGINA:0    Adalberto Ill, NP  Structural Heart, [facility] Valve Program  Spect  [id number]  Fax [phone]  Office [phone]

## 2017-09-13 NOTE — Progress Notes (Signed)
Pt seen with  Daughter for initial TAVR f/u   EKG done & Reviewed by Andrey Cota NP

## 2017-09-13 NOTE — Patient Instructions (Signed)
Structural Heart Program Follow Up  Continue current medical regimen  Follow up with Md's as scheduled.   Cardiac rehab- not interested  Keep your wallet card with you at all times.   Update your dentist with implantation of Bio prosthetic aortic valve.    Structural Heart Program-- 2nd Post Procedure Visit (21-75 days after procedure)  Your 2nd Procedure Range is -09/30/17 to 11/23/17  This appointment will consist of a Nurse Coordinator & Practitioner visit,    Echocardiogram, Ekg, Non- fasting Lab work, & a questionnaire.    Please allow approximately 2 hours for this appointment.    Structural Heart Program --  Last Post Procedure Visit  (1 year after procedure)  Your 1 year post Procedure Range is -07/09/18 to 11/08/18  This appointment will consist of a Nurse Coordinator & Practitioner visit,    Echocardiogram, Ekg, Non- fasting Lab work, & a questionnaire.    Please allow approximately 2 hours for this appointment.     For Clinical questions, please call Mon-Fri 9am to 4pm.   Nolon Nations - Nurse Coordinator @ [phone].  Mitzi Hansen -  Nurse Coordinator @ [phone]  Remi Haggard - Nurse Coordinator @ [phone]     For Scheduling / Financial questions, please call Mon-Fri 9am to 4pm  Sander Radon - Administrative Coordinator @ [phone]

## 2017-09-13 NOTE — Progress Notes (Signed)
TAVR PCCN note  TAVR Referral-  Dr Aneta Mins  RN-JG  NP-SL    TAVR Date-09/09/17  DEVICE & ACCESS--Consensus-09/02/17 23S3 Transcaval  MENSIO- Attached, you'll find the workup for Pt: Ashley Mason.  Appears suited for a 23S3 at 9.7% oversized. Mild to moderate leaflet calcium.    Annulus = 369.51mm2   LVOT = 350.85mm2  SOV = 27/28/27  STJ/Height = 22.7 at 18.50mm  LCA/RCA = 12.4/13.9  Angle = LAO 11  CRAN 17    TF access doesn't appear ideal. Very small vessels. Right appears only slightly larger than the left. Bifurcation lower on the right. Planned for Transcaval approach.  PSS Labs/UA- will draw between 10/12 and 09/01/17    Cardiology-OV - Dr. Karsten Ro 08/02/17  Surgeon-VC-  Dr. Alycia Rossetti 08/03/17  Surgeon-VC - Dr. Garen Grams 08/25/17    Echo- 8/27//18@[facility]   AVA=0.64  Peak Flow Velocity=4.45   MG=49  EF=65%  AI=   MR=Trace TR=Trace   Cath-05/20/17 Non obstructive LAD 50% stenosis  AVA=0.5 MG= EF= %   PAP=35/16 PCWP=15  CTA- 08/12/17 Peri=72  Area=406  Angle=48   LFA=5.2   RFA=5.2  PFT-08/25/17  FEV1= 1.85  97   DLCO Adj= 81%  Carotid-07/27/17 The common carotid arteries appear normal bilaterally, 1-49% stenosis seen in the internal carotid arteries bilaterally. Insignificant stenosis seen in the external carotid arteries bilaterally.Antegrade flow seen in the vertebral arteries bilaterally.The subclavian arteries are within normal limits.  CXR-08/03/17 Mild cardiomegaly, hypoventilatory change  EKG-08/03/17  NORMAL SINUS RHYTHM,LEFT VENTRICULAR HYPERTROPHY,Cannot rule out SEPTAL INFARCT , AGE UNDETERMINED,T WAVE ABNORMALITY, CONSIDER LATERAL ISCHEMIA  Labs-05/13/17  H/H/PLT= 13.3/39.5/275   CR/GFR= 0.5/ 99    Clearances:  Dental Clearance-form given 08/03/17 needs a loose tooth extracted-daughter will call with date  PPM- None     Nursing:  [facility]--5MW done 08/03/17  Teaching Packet-given 08/03/17  TAVR CTA order-faxed 8/30/18and again 08/03/17  PSS Orders- BBan prescription- Medication Cessation list- 08/25/17

## 2017-09-30 ENCOUNTER — Encounter: Payer: Self-pay | Admitting: Adult Health

## 2017-09-30 ENCOUNTER — Ambulatory Visit (HOSPITAL_BASED_OUTPATIENT_CLINIC_OR_DEPARTMENT_OTHER)
Admission: RE | Admit: 2017-09-30 | Discharge: 2017-09-30 | Disposition: A | Discharge status: Home / Self Care | Payer: Medicare Other | Source: Ambulatory Visit | Attending: Adult Health | Admitting: Adult Health

## 2017-09-30 ENCOUNTER — Other Ambulatory Visit: Payer: Self-pay

## 2017-09-30 ENCOUNTER — Ambulatory Visit
Admission: RE | Admit: 2017-09-30 | Discharge: 2017-09-30 | Disposition: A | Discharge status: Home / Self Care | Payer: Medicare Other | Source: Ambulatory Visit | Attending: Adult Health | Admitting: Adult Health

## 2017-09-30 ENCOUNTER — Ambulatory Visit (HOSPITAL_BASED_OUTPATIENT_CLINIC_OR_DEPARTMENT_OTHER): Payer: Medicare Other | Admitting: Adult Health

## 2017-09-30 VITALS — BP 100/67 | HR 92 | Temp 98.3°F | Resp 18 | Wt 162.0 lb

## 2017-09-30 DIAGNOSIS — Z953 Presence of xenogenic heart valve: Secondary | ICD-10-CM

## 2017-09-30 DIAGNOSIS — Z48812 Encounter for surgical aftercare following surgery on the circulatory system: Secondary | ICD-10-CM | POA: Insufficient documentation

## 2017-09-30 DIAGNOSIS — I119 Hypertensive heart disease without heart failure: Secondary | ICD-10-CM | POA: Insufficient documentation

## 2017-09-30 DIAGNOSIS — E119 Type 2 diabetes mellitus without complications: Secondary | ICD-10-CM | POA: Insufficient documentation

## 2017-09-30 LAB — CBC
Absolute NRBC: 0 10*3/uL
Hematocrit: 37.4 % (ref 37.0–47.0)
Hgb: 12.1 g/dL (ref 12.0–16.0)
MCH: 28.9 pg (ref 28.0–32.0)
MCHC: 32.4 g/dL (ref 32.0–36.0)
MCV: 89.3 fL (ref 80.0–100.0)
MPV: 11.1 fL (ref 9.4–12.3)
Nucleated RBC: 0 /100 WBC (ref 0.0–1.0)
Platelets: 265 10*3/uL (ref 140–400)
RBC: 4.19 10*6/uL — ABNORMAL LOW (ref 4.20–5.40)
RDW: 14 % (ref 12–15)
WBC: 5.94 10*3/uL (ref 3.50–10.80)

## 2017-09-30 LAB — ECG 12-LEAD
Atrial Rate: 78 {beats}/min
P Axis: 29 degrees
P-R Interval: 180 ms
Q-T Interval: 374 ms
QRS Duration: 102 ms
QTC Calculation (Bezet): 426 ms
R Axis: -27 degrees
T Axis: 122 degrees
Ventricular Rate: 78 {beats}/min

## 2017-09-30 LAB — BASIC METABOLIC PANEL
BUN: 22 mg/dL — ABNORMAL HIGH (ref 7.0–19.0)
CO2: 25 mEq/L (ref 22–29)
Calcium: 10.2 mg/dL (ref 8.5–10.5)
Chloride: 104 mEq/L (ref 100–111)
Creatinine: 0.7 mg/dL (ref 0.6–1.0)
Glucose: 141 mg/dL — ABNORMAL HIGH (ref 70–100)
Potassium: 3.9 mEq/L (ref 3.5–5.1)
Sodium: 136 mEq/L (ref 136–145)

## 2017-09-30 LAB — GFR: EGFR: >60

## 2017-09-30 NOTE — Progress Notes (Signed)
[place] STRUCTURAL HEART PROGRAM - PROGRESS NOTE      Date & Time: 09/30/2017 6:04 PM  Patient Name: Ashley Mason      Subjective:  30 day f/u s/p TAVR S3 #23 via transcaval approach by Dr. Alycia Rossetti on 09/09/17.  D/c home  09/10/17. plavix was started per protocol.    She presents with daughter. She reports improvement in SOB and fatigue. She is ready to do more exercise but is unable to go to cardiac rehab due to transportation issues.    Current Outpatient Prescriptions:   .  aspirin 81 MG chewable tablet, Last dose day of surgery, Disp: , Rfl:   .  atorvastatin (LIPITOR) 80 MG tablet, Take 1 Tab by Mouth Once a Day., Disp: , Rfl:   .  CALCIUM PO, Take 1,200 mg by mouth daily.  , Disp: , Rfl:   .  clopidogrel (PLAVIX) 75 mg tablet, Take 1 tablet (75 mg total) by mouth daily., Disp: 90 tablet, Rfl: 0  .  metFORMIN (GLUCOPHAGE) 500 MG tablet, Take 1 Tab by Mouth Twice Daily., Disp: , Rfl:       Objective:      Physical Exam:       Vitals:    09/30/17 1313   BP: 100/67   BP Site: Right arm   Patient Position: Sitting   Pulse: 92   Resp: 18   Temp: 98.3 F (36.8 C)   TempSrc: Oral   SpO2: 96%   Weight: 73.5 kg (162 lb)       Physical Exam    General: wdwn female NAD    Cardiovascular: RRR S1S2  No murmur    Lungs:clear bilat    Abdomen:soft nontender Bs+    Extremities: no edema, groin sites healing, no bruising, no redness or swelling    Lab Review:         09/30/17   1321   WBC  5.94   Hgb  12.1   Hematocrit  37.4   Platelets  265                09/30/17   1321   Sodium  136   Potassium  3.9   CO2  25            09/30/17   1321   BUN  22.0*   Creatinine  0.7   Glucose  141*               ECHO 09/30/17  Summary    * The left ventricle is normal in size.    * Left ventricular ejection fraction is normal with an estimated ejection  fraction of Estimated LV EF 65% by visual inspection.    * There is mild concentric left ventricular hypertrophy.    * Left ventricular segmental wall motion is normal.    * The right  ventricular cavity size is normal in size.    * Normal right ventricular systolic function.    * A bioprosthetic valve is identified in the aortic position, which was not  well visualized but appears well-seated.    * The bioprosthetic exhibit normal function.  Peak flow velocity across the  bioprosthetic aortic valve ia 228 cm/s , with a mean gradient of  11 mmHg,  aortic valve area of  1.42 cm2 by the Continuity equation, and a dimensionless  index of  0.49.    * There is no aortic regurgitation or perivalvular leak.    * There  is trace tricuspid regurgitation.    * Estimated right ventricular systolic pressure is at least  28 mmHg.    * Compared to the prior study dated 09/10/2017,  there has been no  significant change.        EKG  09/30/17  NORMAL SINUS RHYTHM  MODERATE VOLTAGE CRITERIA FOR LVH, MAY BE NORMAL VARIANT  Cannot rule out SEPTAL INFARCT (CITED ON OR BEFORE 13-Sep-2017)  T WAVE ABNORMALITY, CONSIDER LATERAL ISCHEMIA  ABNORMAL ECG  WHEN COMPARED WITH ECG OF 13-Sep-2017 12:50,  NO SIGNIFICANT CHANGE WAS FOUND      Assessment/Plan:    1. 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

## 2017-09-30 NOTE — Progress Notes (Signed)
Pt seen with daughter for 30 day post TAVR f/u   EKG, CBC, BMP done & Reviewed by S Loman NP    Echo done, results not avail at this time.    KCCQ-12- Done

## 2017-09-30 NOTE — Patient Instructions (Addendum)
Structural Heart Program Follow Up    Structural Heart Program --  Last Post Procedure Visit  (1 year after procedure)  Your 1 year post Procedure Range is 07/09/18 to 11/08/18  This appointment will consist of a Nurse Coordinator & Practitioner visit,    Echocardiogram, Ekg, Non- fasting Lab work, & a questionnaire.    Please allow approximately 2 hours for this appointment.     For Clinical questions, please call Mon-Fri 9am to 4pm.   Nolon Nations - Nurse Coordinator @ [phone].  Mitzi Hansen -  Nurse Coordinator @ [phone]  Remi Haggard - Nurse Coordinator @ [phone]     For Scheduling / Financial questions, please call Mon-Fri 9am to 4pm  Sander Radon - Administrative Coordinator @ [phone]

## 2019-02-03 NOTE — Progress Notes (Signed)
[facility]    PCCN from TAVR    TAVR Referral-  Dr Aneta Mins  RN-JG  NP-SL   TAVR Date-09/09/17   Post TAVR Echo-09/10/17  AVA=1.6  PV=2.31   MG=12   EF=68  AI=none   MR=trace  TR=trace   EKG-09/10/17  NST,T wave abnormaility, consider Lateral ischemia    30 day Range-09/30/17 to 11/23/17       1 yr Range-07/09/18 to 11/08/18    Post op 09/13/17  NP/RN F/U-   AV/JG     EKG-   Routing-faxed to [facility] on 09/13/17  Meds on D/C-Plavix  Cardiac Rehab-pt/daughter refused  Wallet Card-pt has a copy    30 day F/U date 09/30/17            Range 09/30/17 to 11/23/17    Post Echo-  AVA=1.42  Vmax=ns   MG=11   EF=60-65%  AI=none   MR=none  TR=trace   EKG-NORMAL SINUS RHYTHM,MODERATE VOLTAGE CRITERIA FOR LVH, MAY BE NORMAL VARIANT,Cannot rule out SEPTAL INFARCT (CITED ON OR BEFORE 13-Sep-2017)T WAVE ABNORMALITY, CONSIDER LATERAL ISCHEMIA  NP/RN F/U-  SL/jg  Routing- faxed to Dr Colleen Can on 10/01/17      1 yr F/U                 Range- 07/09/18 to 11/08/18  Post Echo-  AVA=  Vmax=   MG=   EF=  AI=   MR=  TR=   EKG  NP/RN F/U-     Routing

## 2019-08-24 IMAGING — US US SOFT TISSUE HEAD NECK
1 series · 7 of 7 positions shown · non-contrast
Comparison: None

HISTORY: 73 year-old female with headache, bump on head.
TECHNIQUE: Targeted gray-scale and color Doppler ultrasound was performed to the area of palpable abnormality as indicated by patient.

[Series 1: us soft tissue head neck · 7 of 7 slices shown]
[im 1/7]
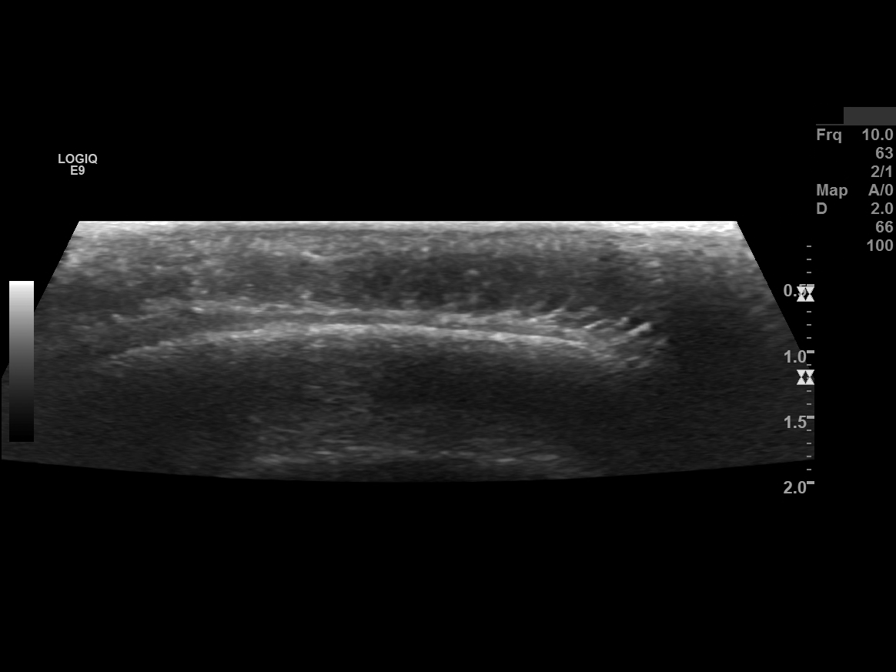
[im 2/7]
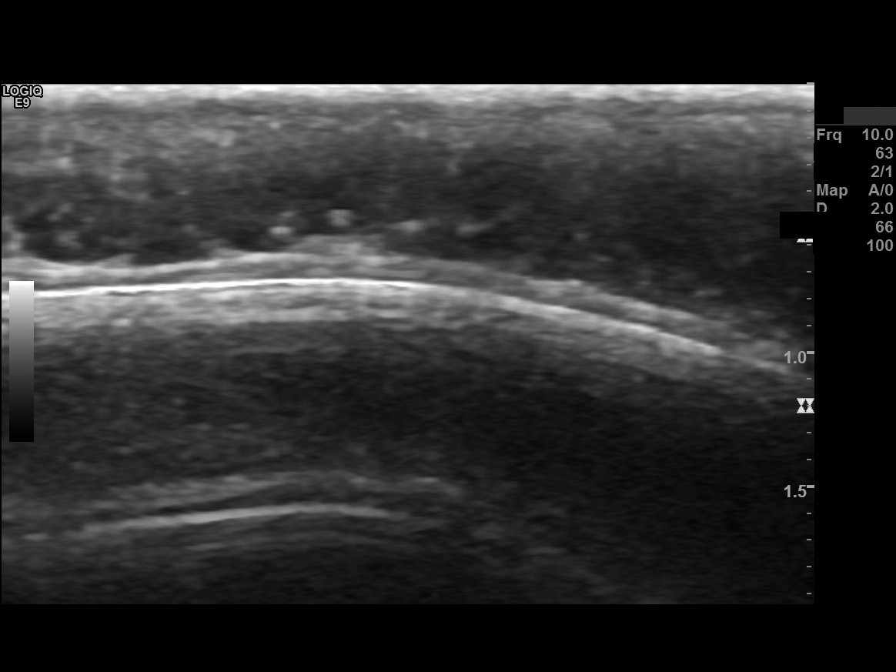
[im 3/7]
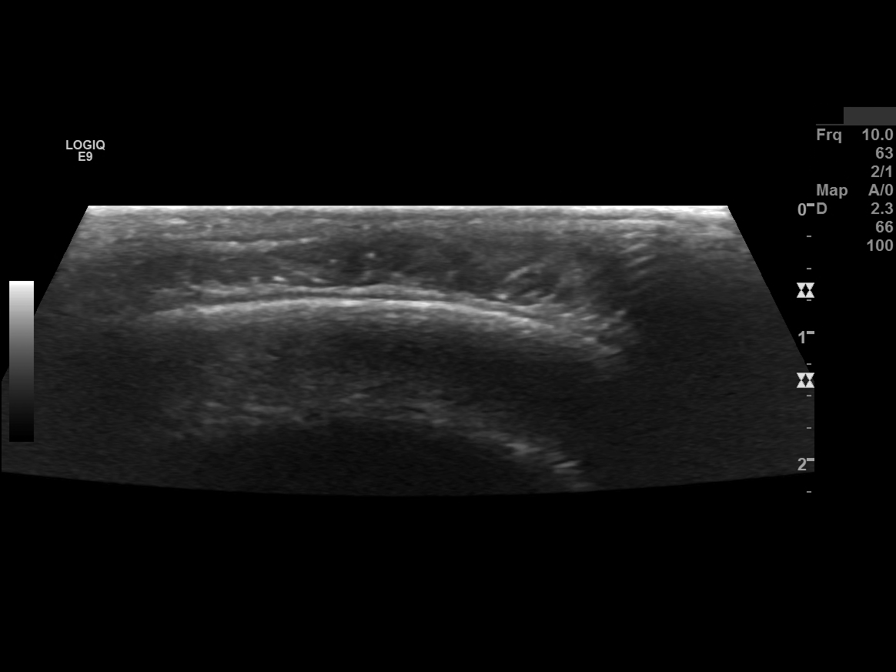
[im 4/7]
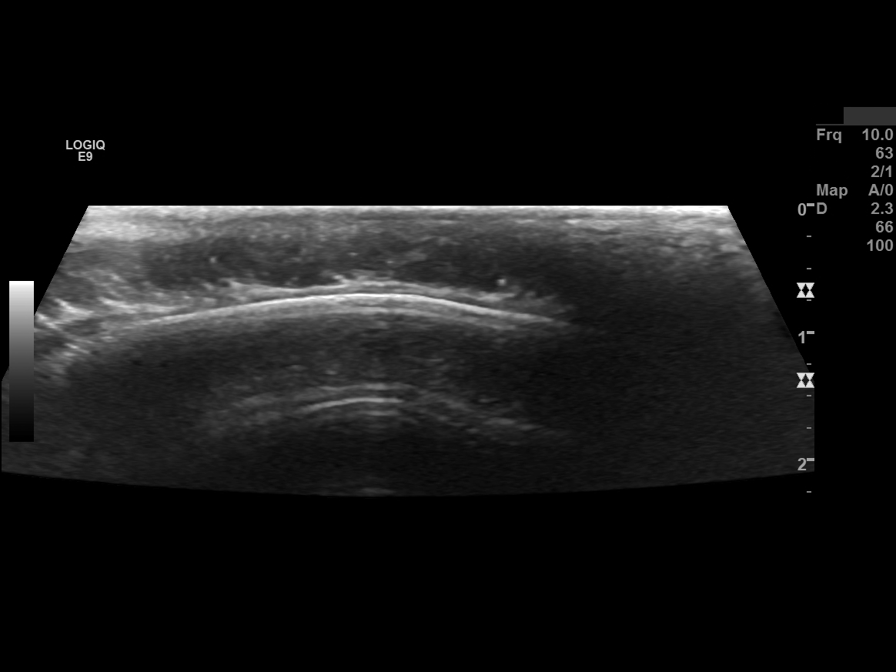
[im 5/7]
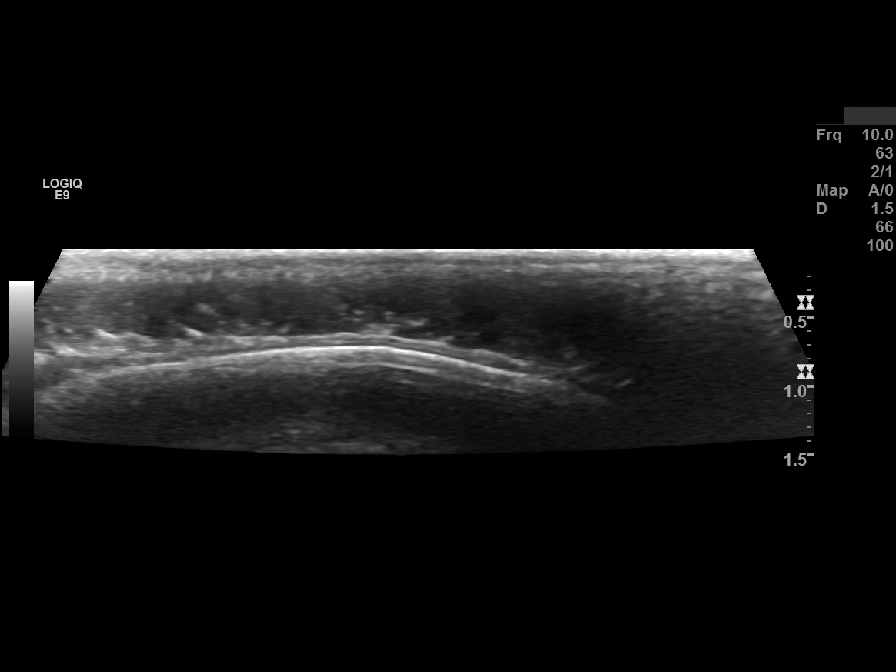
[im 6/7]
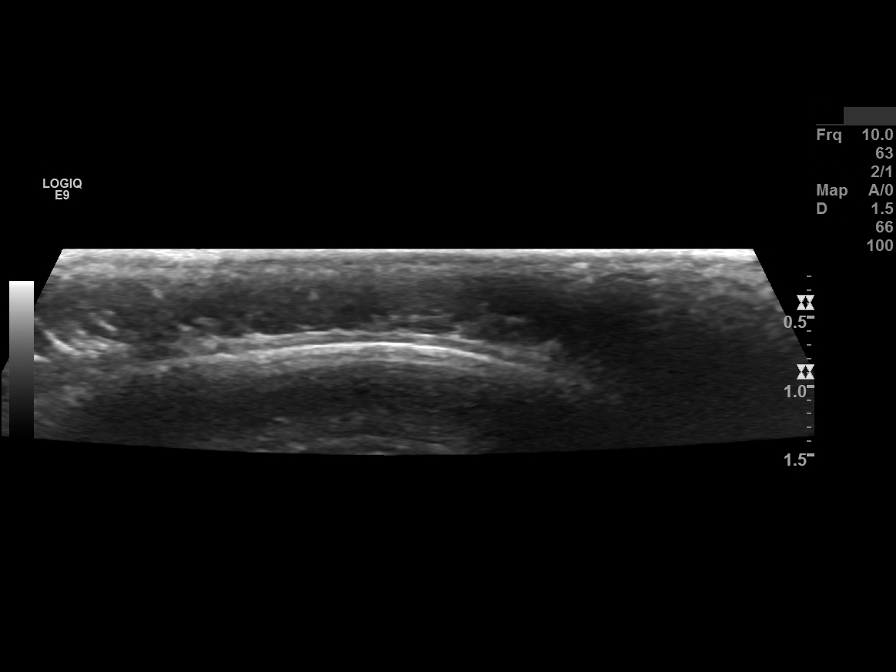
[im 7/7]
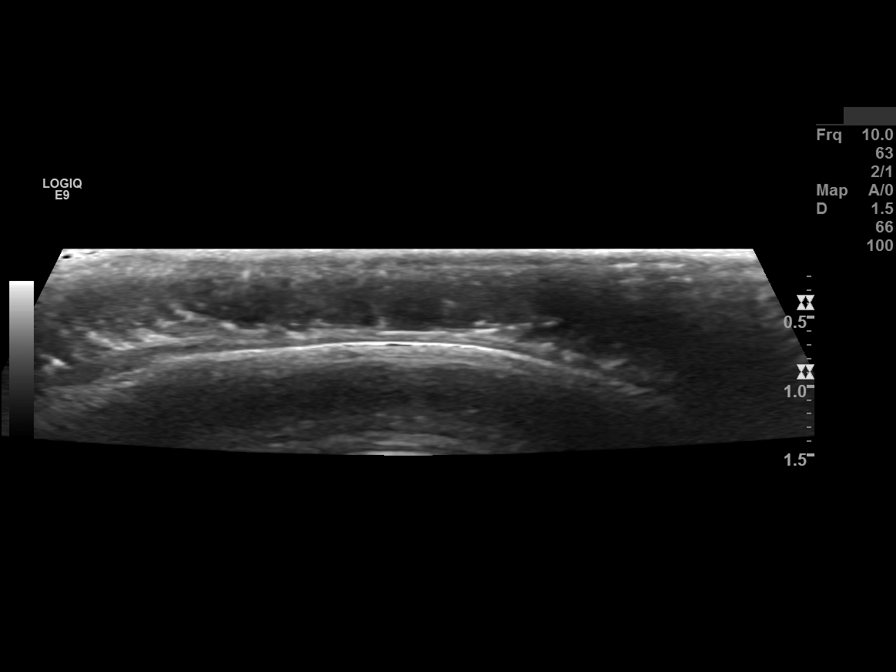

[7 of 7 positions shown; findings below may reference images not displayed]

FINDINGS: Targeted ultrasound in the area of palpable lump in the left scalp demonstrates no abnormal solid or cystic mass lesions. The calvarial contour is smooth.
IMPRESSION: No sonographic correlate to palpable lump in the left scalp.

## 2019-08-24 IMAGING — CR SKULL 4 VWS MIN
2 series · 5 of 5 positions shown · non-contrast
Comparison: None.

HISTORY: Headache, lump on head. Patient fell 2 weeks ago.
TECHNIQUE: Five-view skull series.

[Series 1: pa · 0.17mm/px · 4 of 4 slices shown]
[im 1/4]
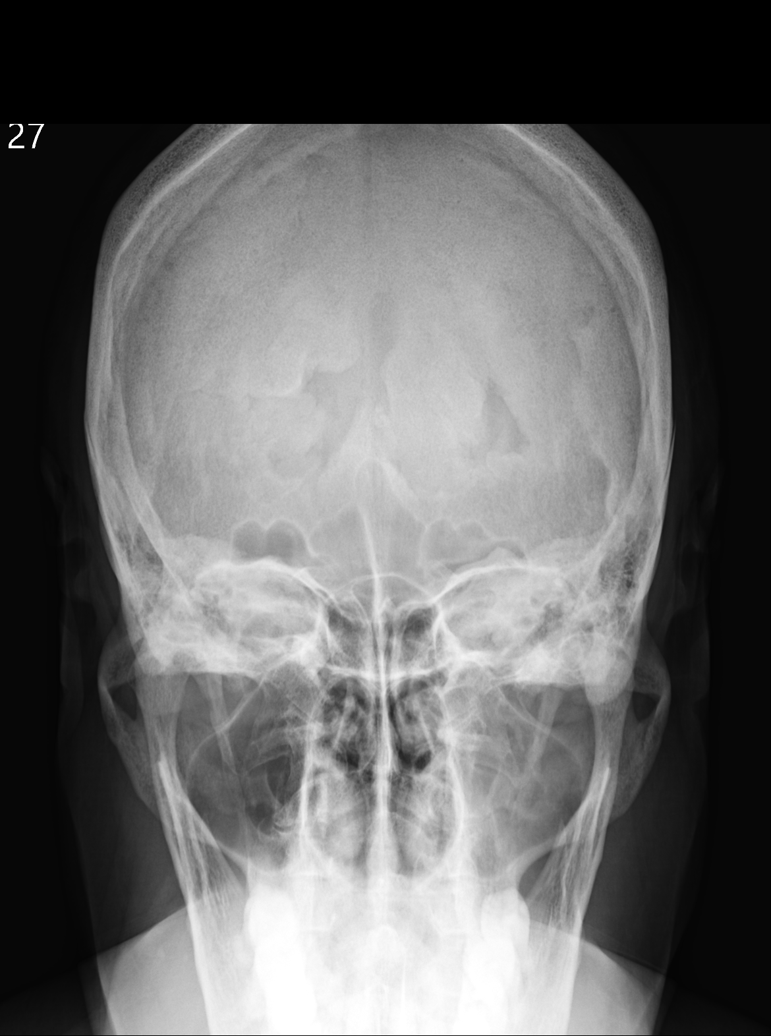
[im 2/4]
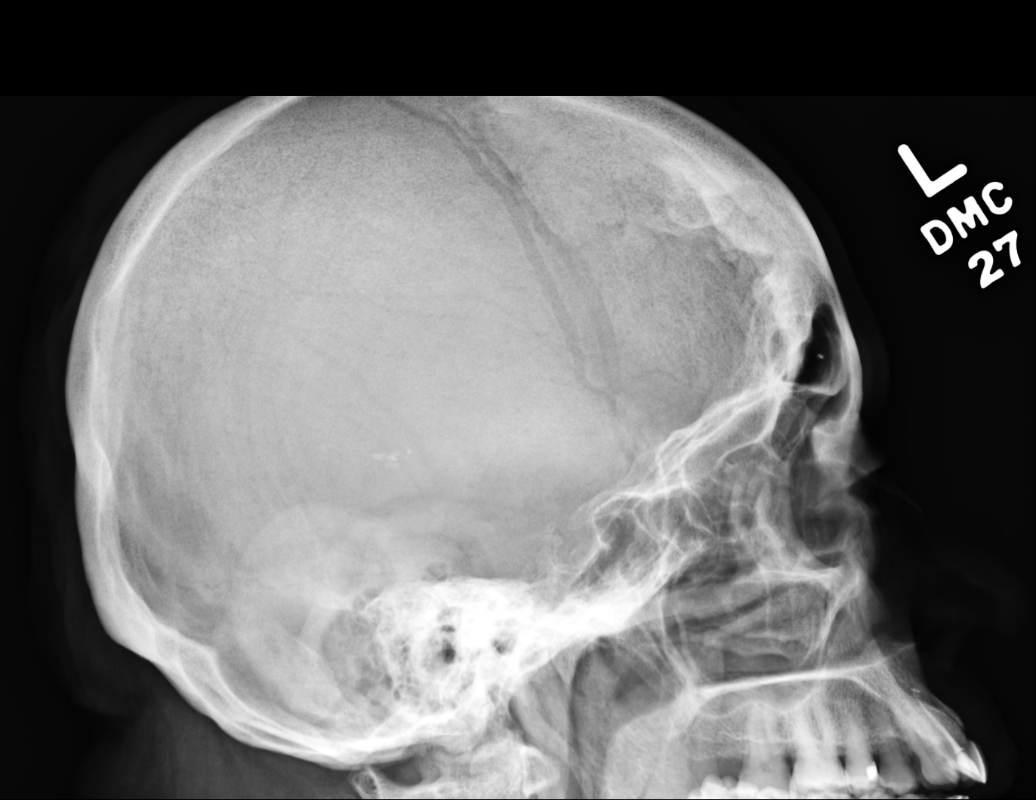
[im 3/4]
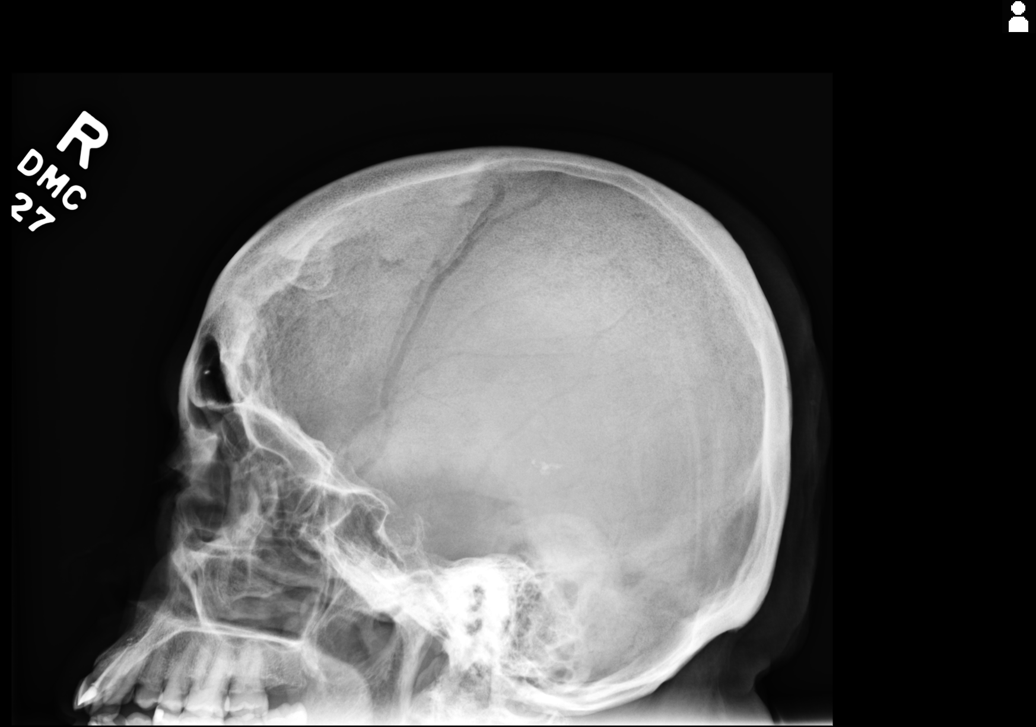
[im 4/4]
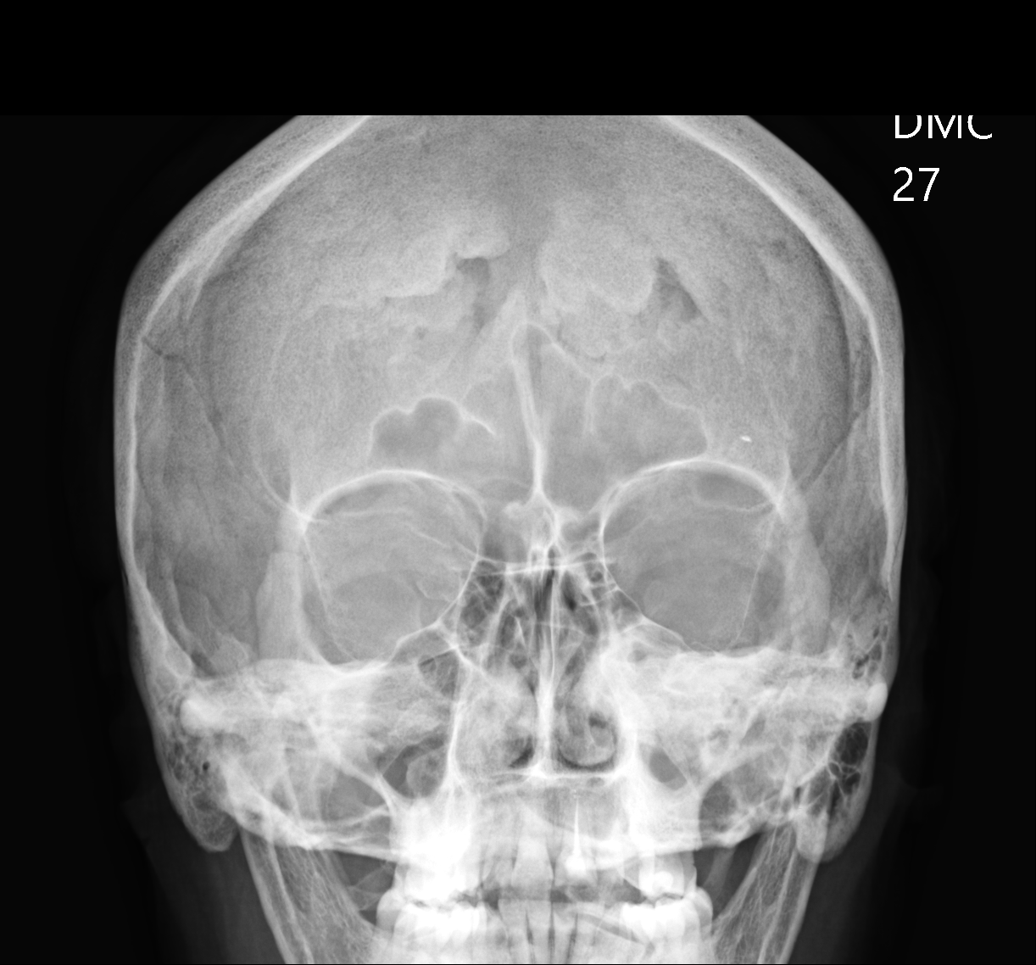

[[person_name]]
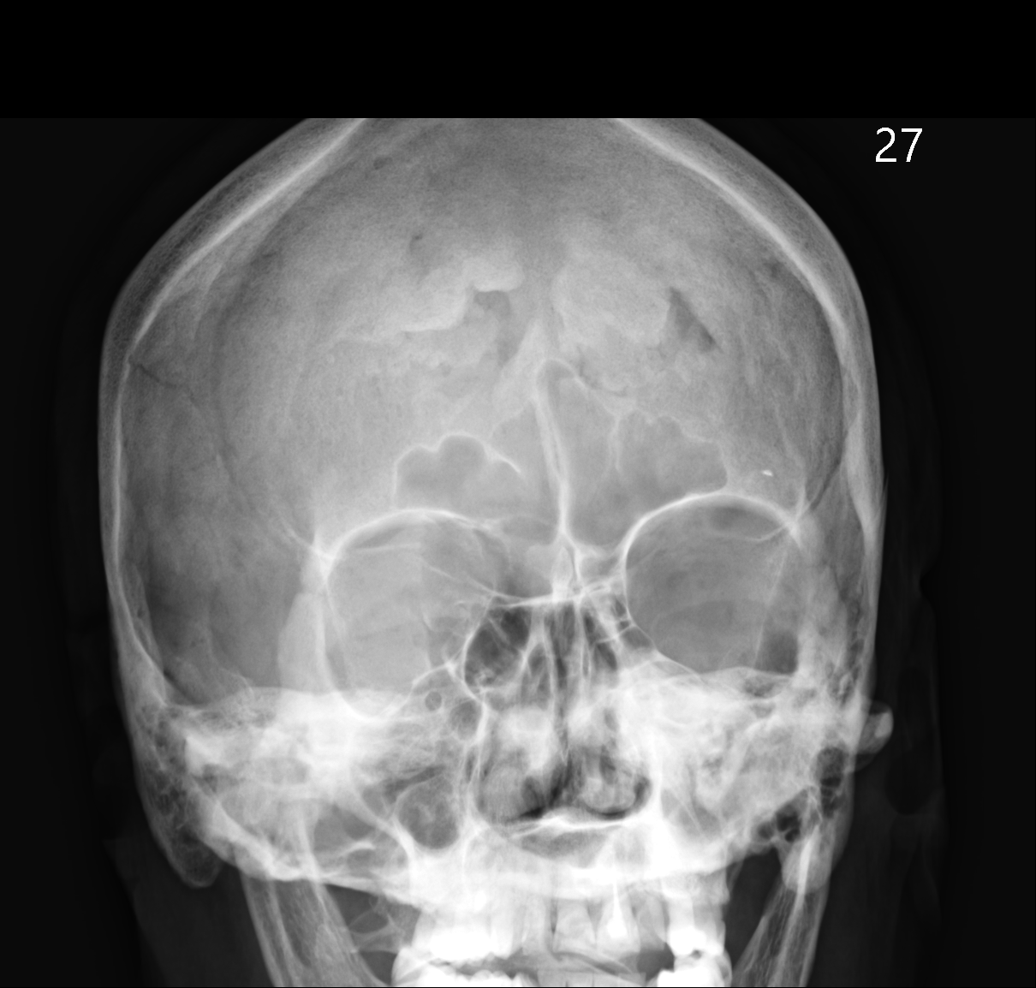

[5 of 5 positions shown; findings below may reference images not displayed]

FINDINGS: There is hyperostosis frontalis interna. There is no calvarial fracture. There is no evidence of increased intracranial pressure. Calcified pineal gland is present in the midline.

Paranasal sinuses are normal.
IMPRESSION: Normal skull.

## 2019-12-06 IMAGING — CT CTA NECK WO-W CONTRAST
2 of 4 series · 9 of 27 positions shown · IV contrast (agent unspecified)
Comparison: None

HISTORY: 73-year-old female with carotid artery stenosis.
TECHNIQUE: CT angiography was obtained from the aortic arch to the level of the base of the skull first without contrast followed by the intravenous administration of nonionic contrast material. Coronal, sagittal, and bilateral oblique reconstructions were obtained.  Multiplanar MIP reconstructions were created. 2-D and 3-D reconstructed images also obtained under concurrent physician supervision.

IV contrast: 60 mL Esovue-7DD. Serum creatinine is 0.5 mg/dL by i-STAT.

[Series 9: angio · axial · 0.45mm/px · z∈[-1031,-934]mm · 2 of 293 slices shown]
[im 98/293  soft-tissue]
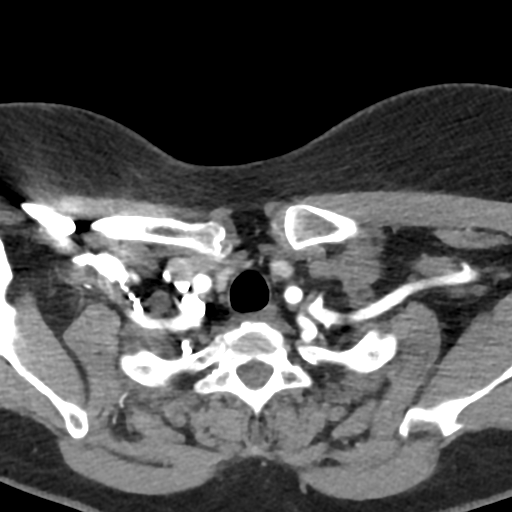
[im 195/293  soft-tissue]
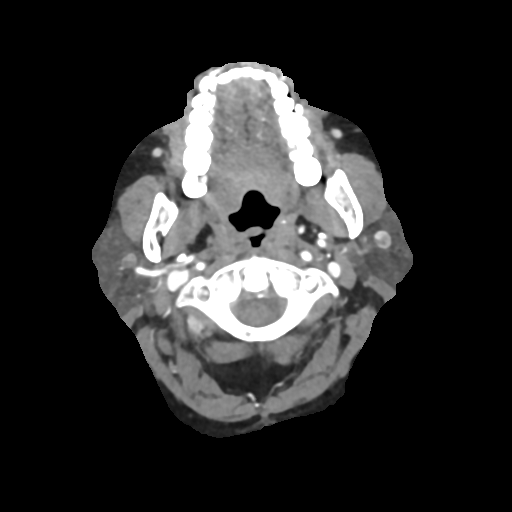

[Series 10: neurodsa_cta(10)_ne-ct(8)_(phone_number)_auto · axial · 0.45mm/px · z∈[-1092,-873]mm · 7 of 587 slices shown]
[im 74/587  soft-tissue]
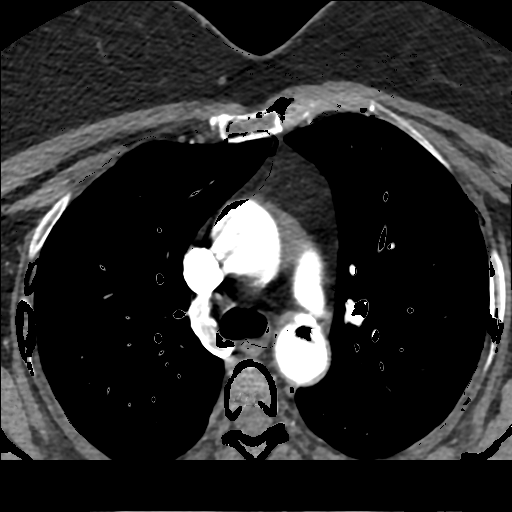
[im 147/587  bone]
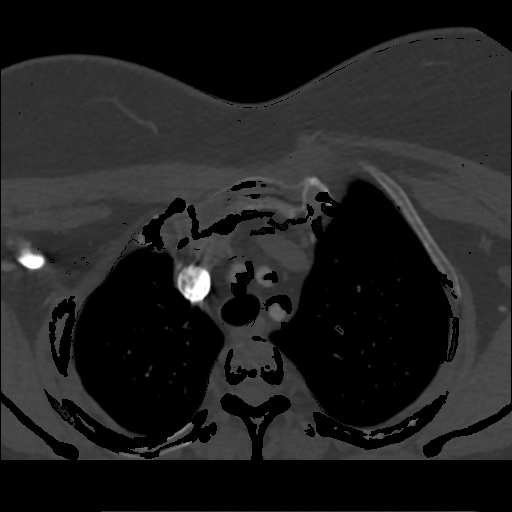
[im 220/587  soft-tissue]
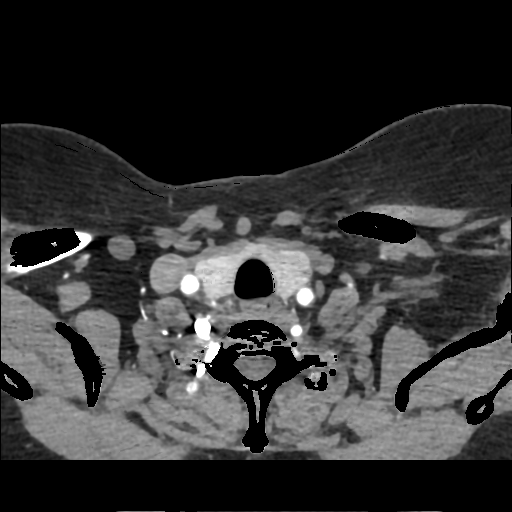
[im 294/587  bone]
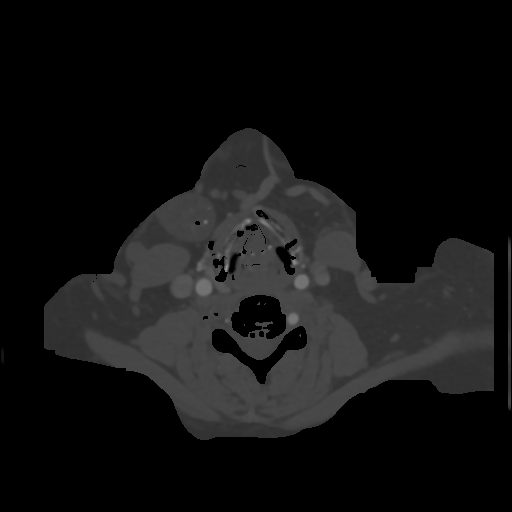
[im 367/587  soft-tissue]
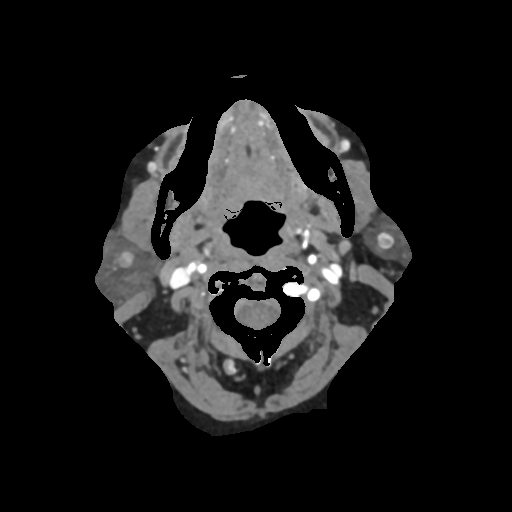
[im 440/587  bone]
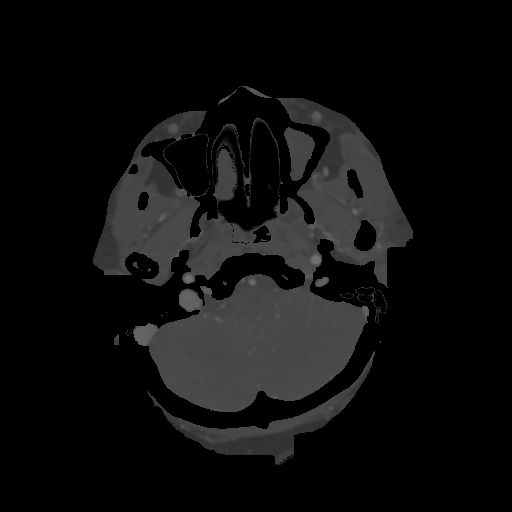
[im 513/587  soft-tissue]
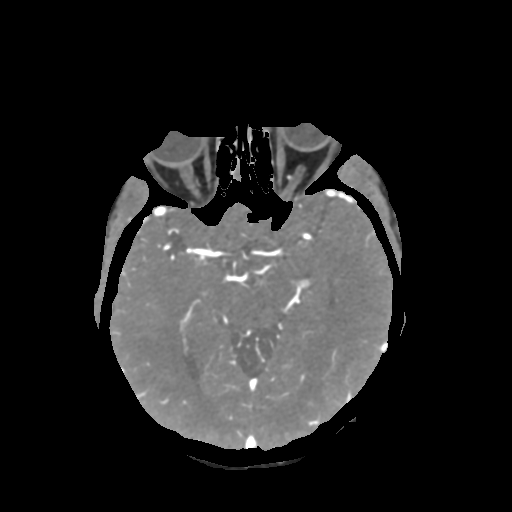

[9 of 27 positions shown; findings below may reference images not displayed]

FINDINGS: Aortic arch: Severe stenosis at the origin of the brachiocephalic trunk secondary to atherosclerotic plaque (greater than 70%). Scattered atherosclerotic plaque in the aorta and other arch vessels without significant stenosis.

Vertebral arteries: The left vertebral artery is patent.  The right vertebral artery is severely hypoplastic but patent.

Right carotid artery: Atherosclerotic plaque at the carotid bifurcation and carotid bulb results in mild stenosis at the origin of the right ICA (30%)and moderate stenosis in the distal CCA (50%). Less than 25% stenosis at the origin of the ECA.

Left carotid artery: Minimal atherosclerotic plaque at the carotid bifurcation without significant stenosis in the CCA or ICA. Focal atherosclerotic plaque in the proximal ECA results in less than 25% stenosis.

A bypass graft extends between the mid left and right common carotid arteries. No stenosis within the vascular graft.

Completely opacified left maxillary sinus with thickening of the sinus walls indicating chronic osteitis. Heterogeneous, enlarged thyroid gland with bilateral nodules. The largest nodule is in the left lobe, contains calcifications and measures up to 2 cm. Small enhancing density just below the level of the hyoid bone, likely incidental ectopic thyroid. 3 mm subpleural nodule in the right upper lobe of the lung of doubtful clinical significance.

2-D and 3-D reconstructive images confirmed the above findings.
IMPRESSION: Mild stenosis at the origin of the right ICA (30%).

Moderate stenosis in the distal right CCA (50%).

Less than 25% stenosis at the origin of the left and right ECAs.

Severe stenosis in the brachiocephalic trunk (greater than 70%).

Patent vascular graft between the left and right common carotid arteries.

Heterogeneous, enlarged and nodular thyroid gland. Largest nodule measures up to 2 cm. Consider further evaluation with thyroid ultrasound.

Total radiation dose to patient is CTDIvol 44.61 mGy and DLP 624.56 mGy-cm.

## 2020-01-30 IMAGING — MG MAMMO DIAG BIL W/CAD TOMO
8 series · 8 of 24 positions shown · non-contrast
Comparison: No prior imaging studies are available for comparison.

HISTORY: Patient is 73 years old and is seen for diagnostic evaluation of pain in the superior left breast. The patient has no personal history of cancer. The patient does not have a first degree relative with breast cancer.
TECHNIQUE: Bilateral 2-D digital diagnostic mammogram was performed followed by 3-D tomosynthesis.  Current study was also evaluated with a computer aided detection (CAD) system.

MAMMOGRAM FINDINGS:  There are scattered areas of fibroglandular density.
No suspicious abnormality is seen in either breast.

[R MLO]
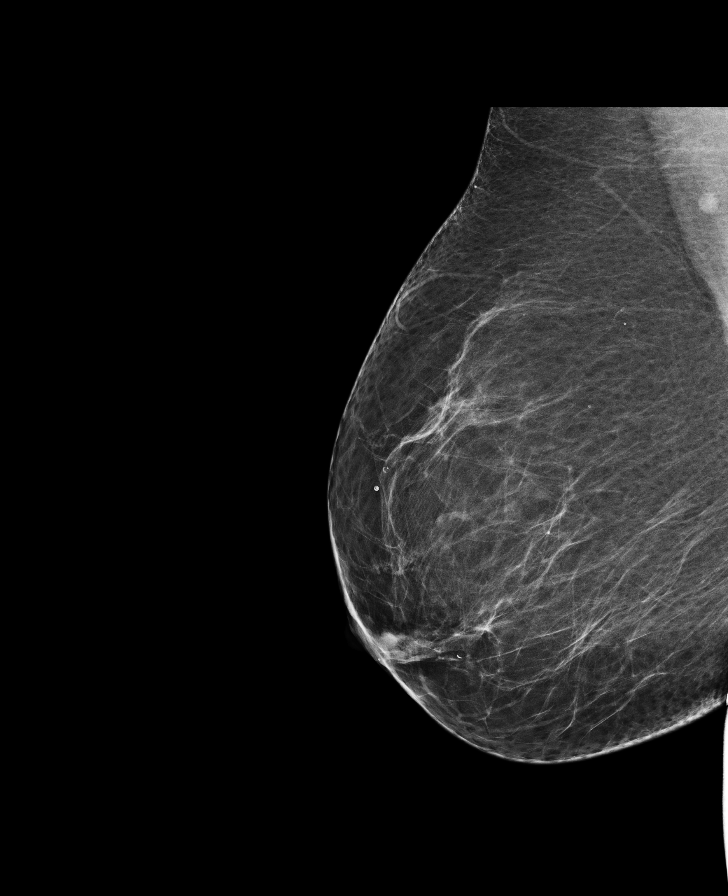

[L CC]
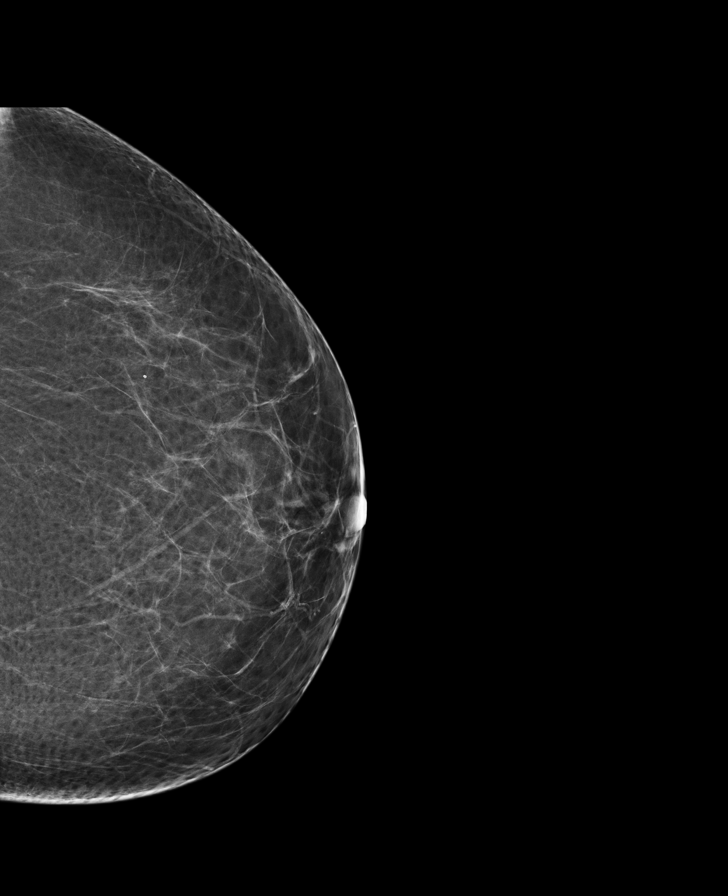

[R CC]
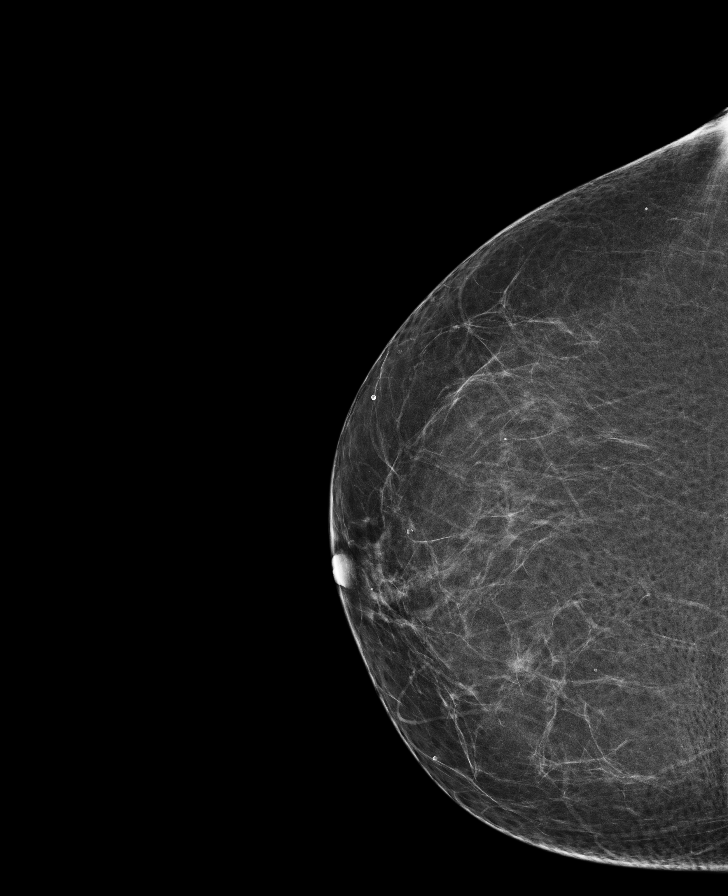

[L MLO]
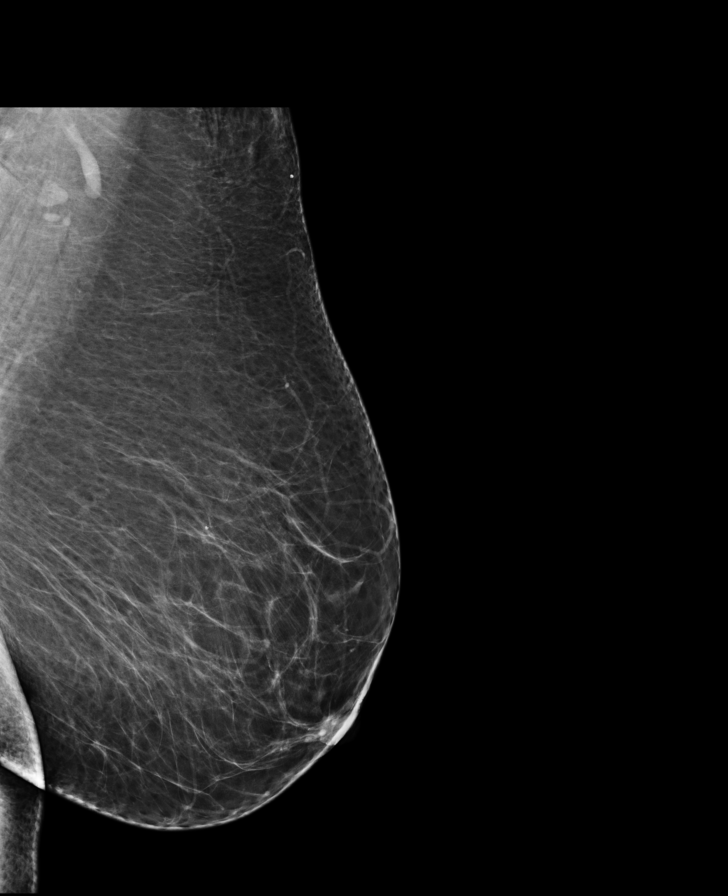

[L MLO tomo · tomo slice 49/97.0]
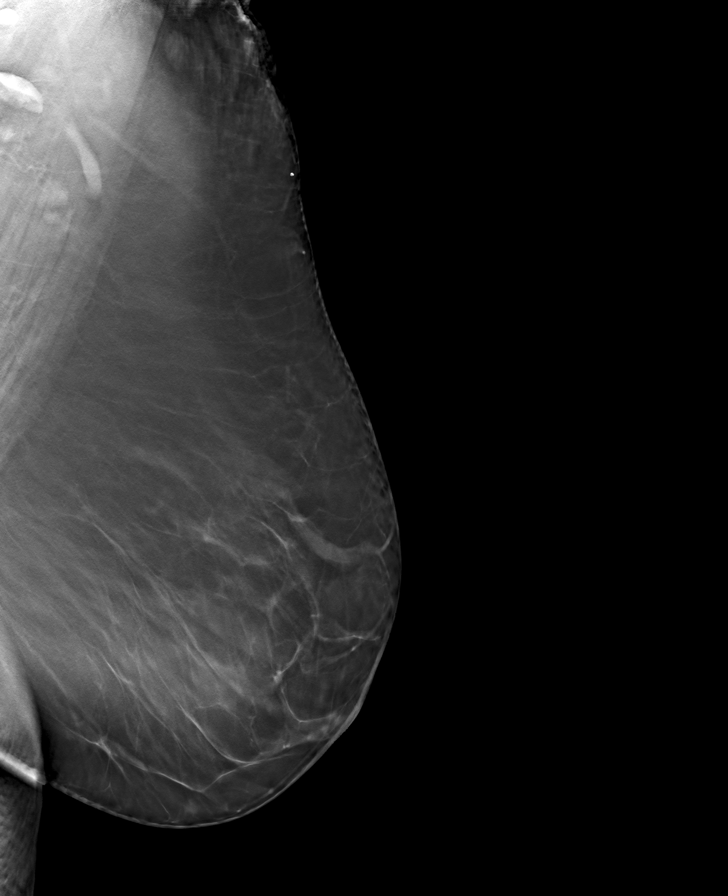

[L CC tomo · tomo slice 39/76.0]
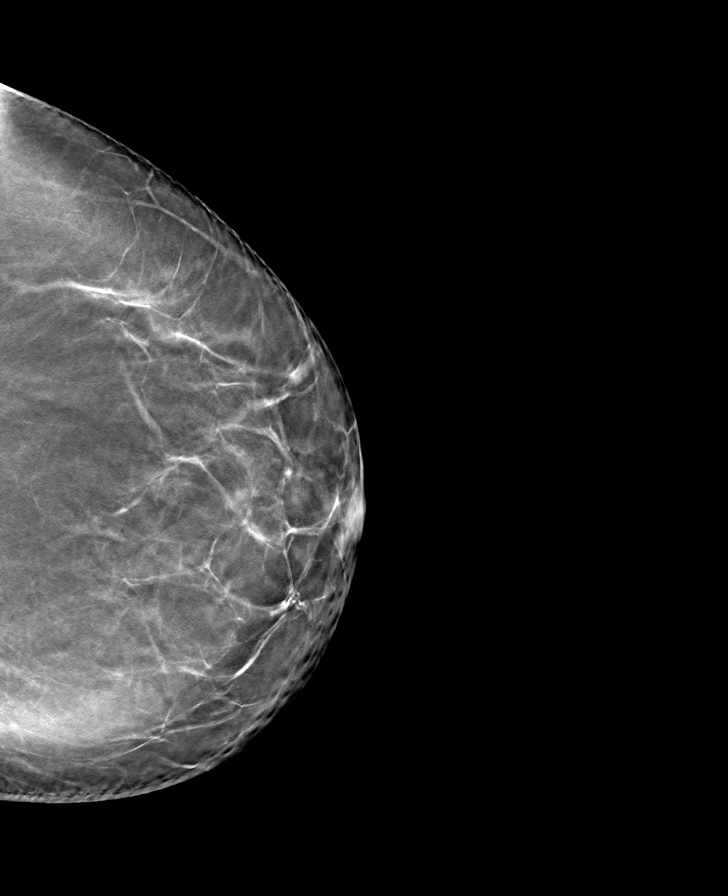

[R MLO tomo · tomo slice 47/92.0]
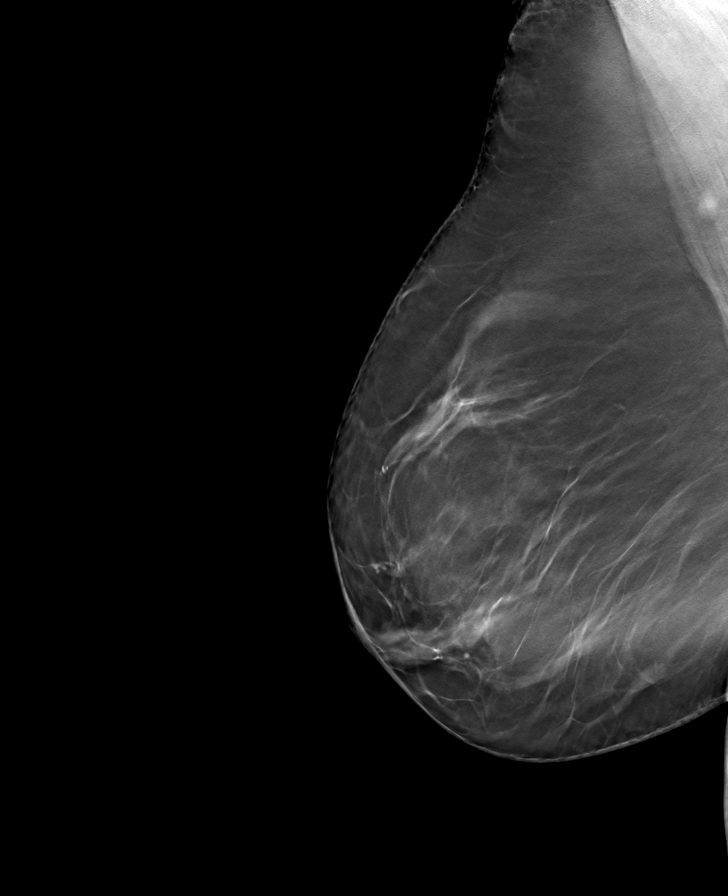

[R CC tomo · tomo slice 40/79.0]
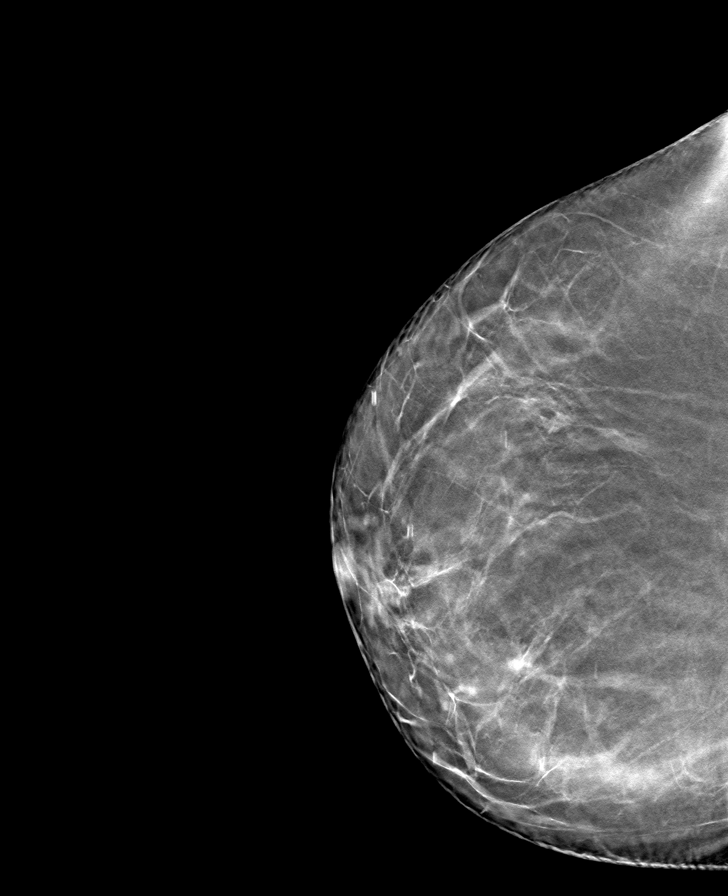

[8 of 24 positions shown; findings below may reference images not displayed]

IMPRESSION: There is no mammographic evidence of malignancy. Follow-up with referring for left breast pain as clinically indicated.

A return to screening in 1 year is recommended.

BI-RADS Category 1: Negative

## 2020-02-16 IMAGING — US US THYROID
1 series · 14 of 25 positions shown · non-contrast
Comparison: None.

HISTORY: 73-year-old female with thyroid nodule.
TECHNIQUE: Using real-time and Doppler ultrasound, the thyroid gland was evaluated.

[Series 2: us thyroid · 43 acquisitions, 14 frames shown]
[im 1/43]
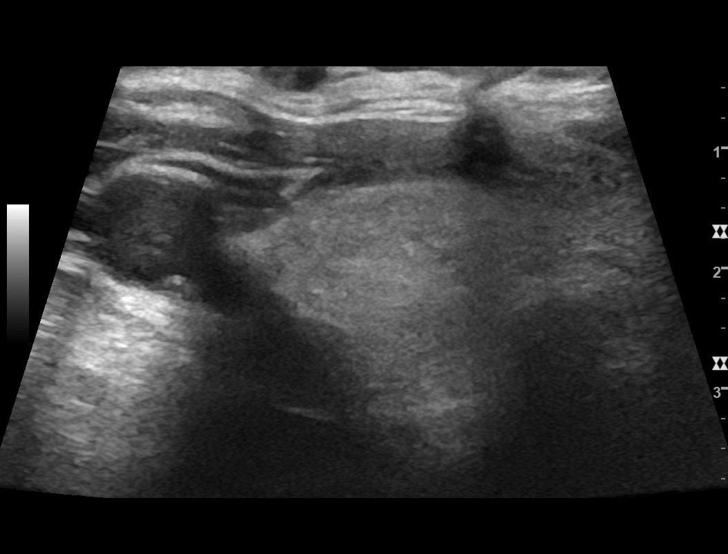
[im 4/43]
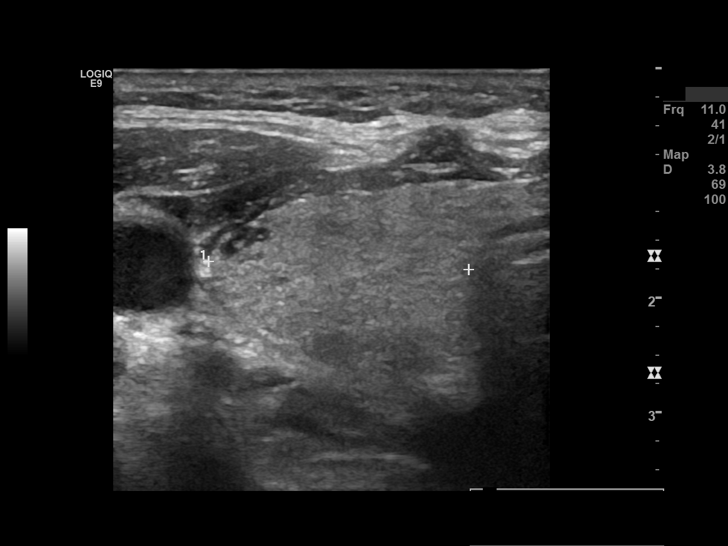
[im 8/43]
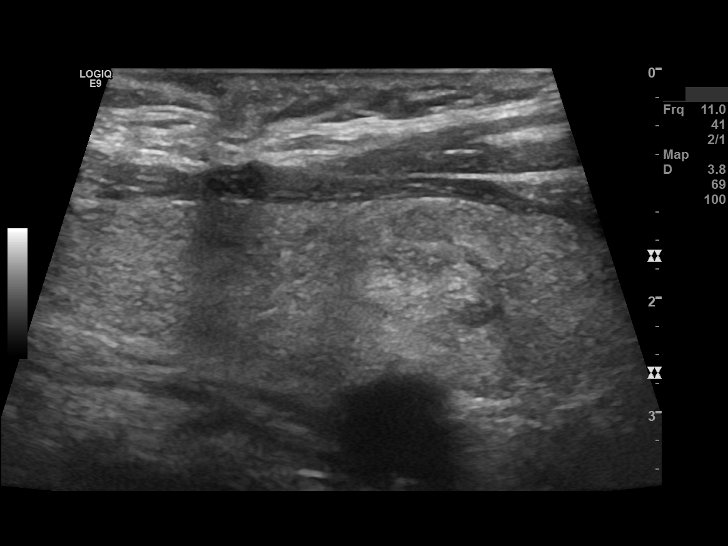
[im 11/43]
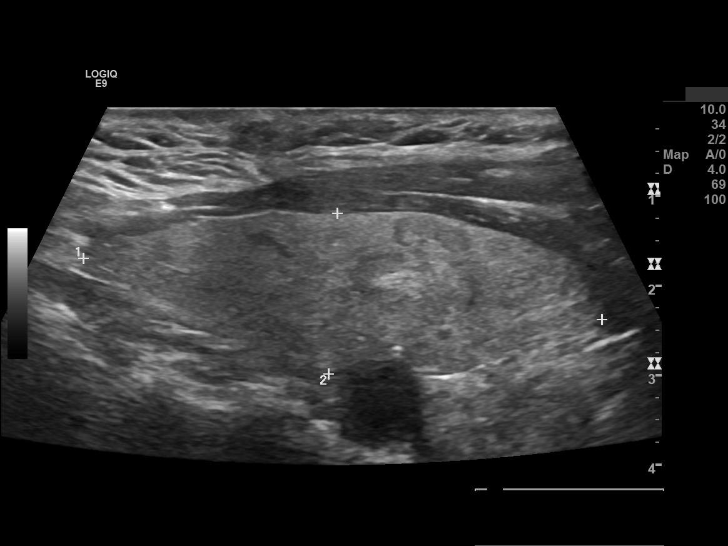
[im 15/43]
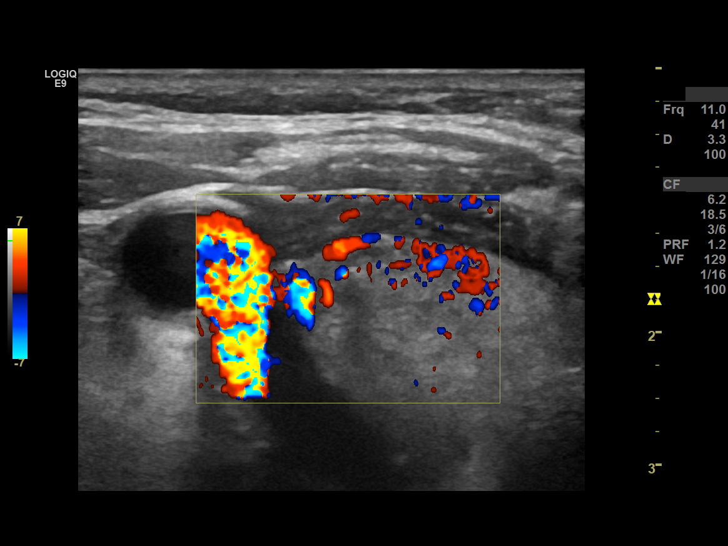
[im 16/43]
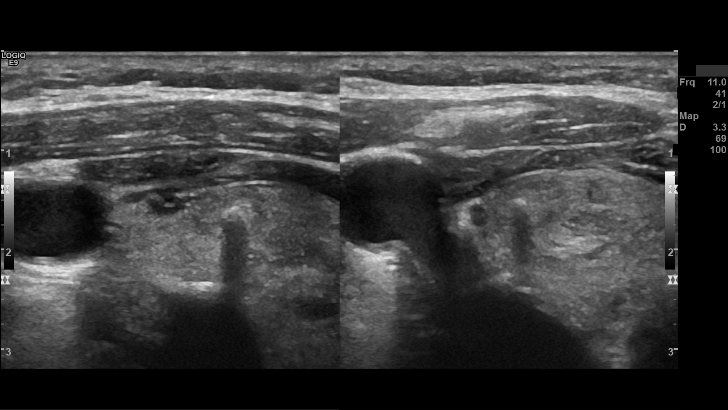
[im 20/43]
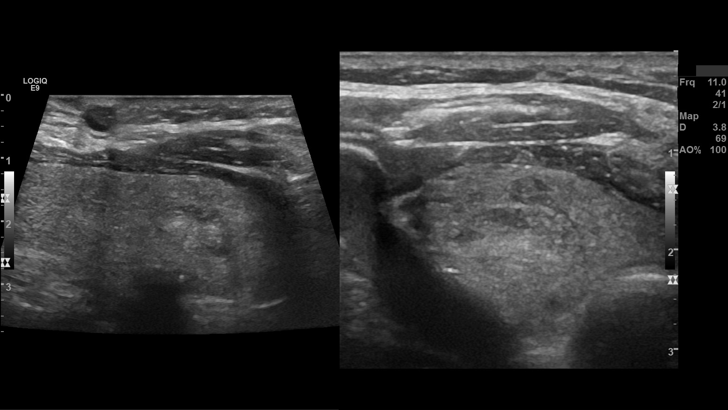
[im 23/43]
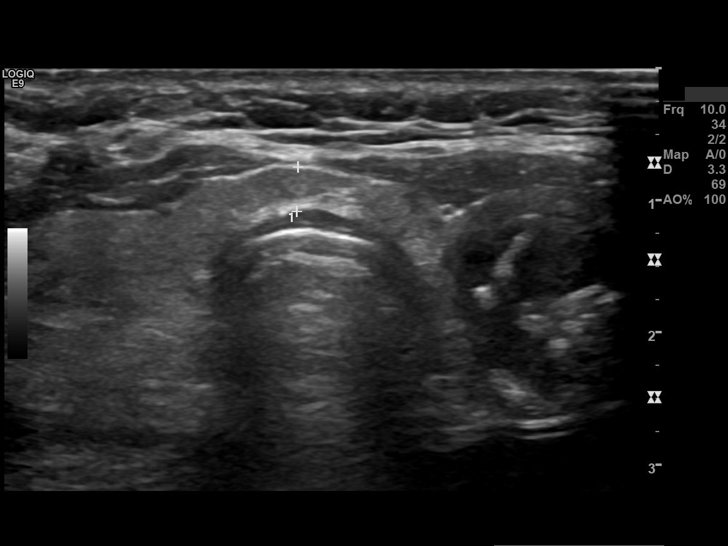
[im 27/43]
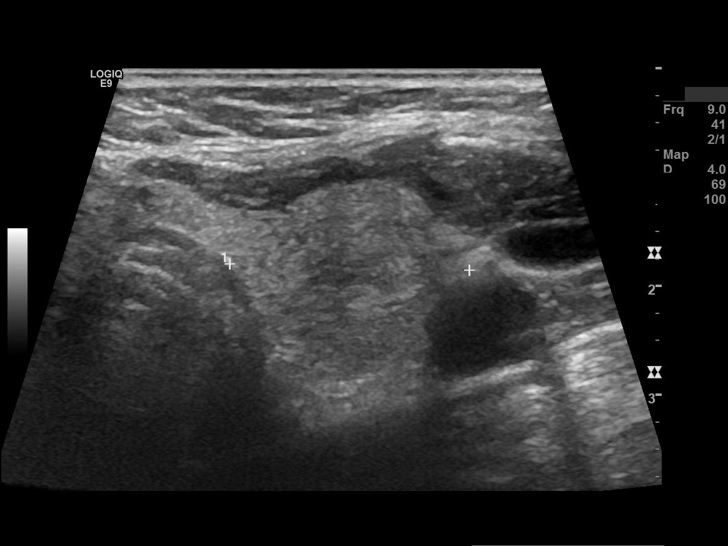
[im 29/43]
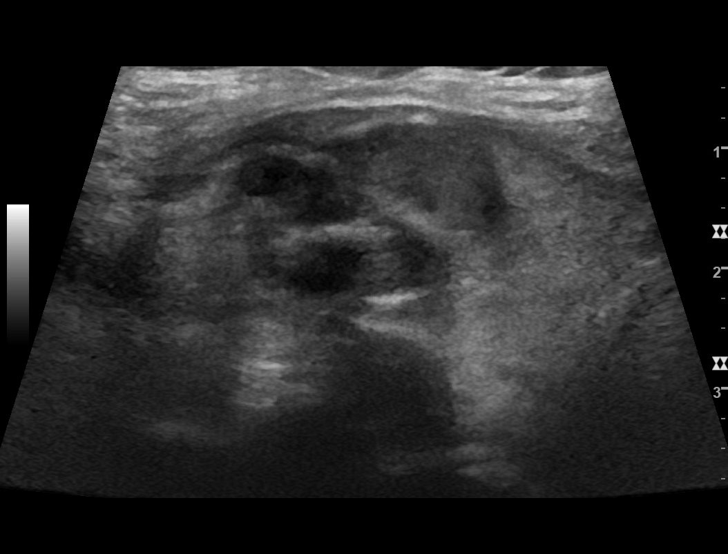
[im 32/43]
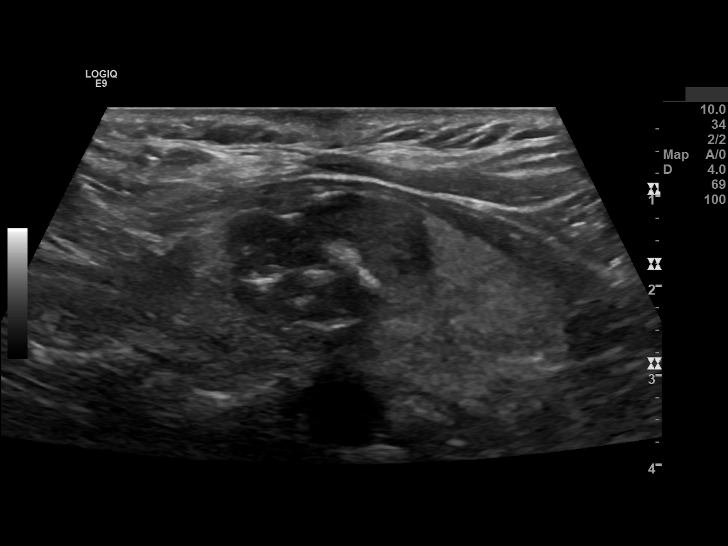
[im 36/43]
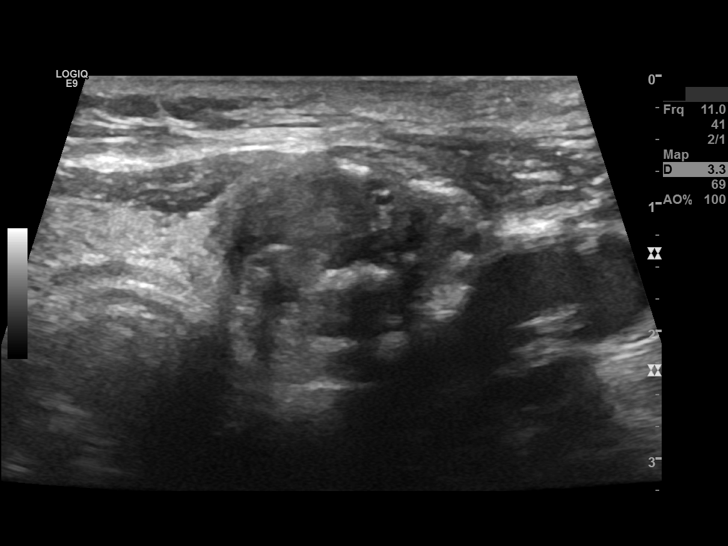
[im 39/43]
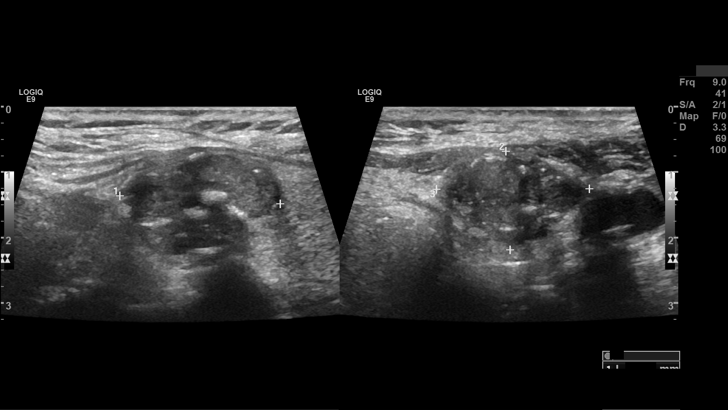
[im 43/43]
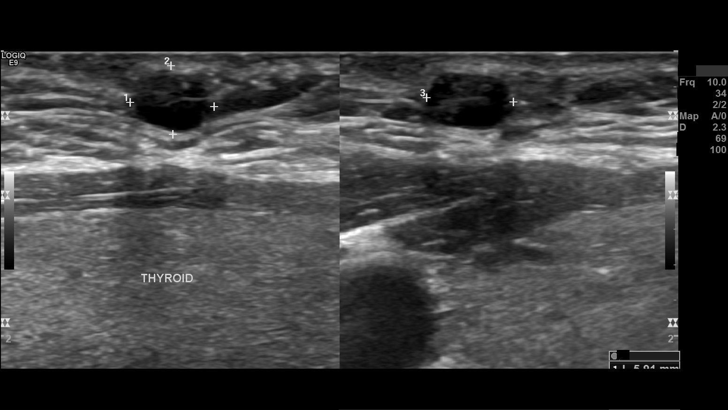

[14 of 25 positions shown; findings below may reference images not displayed]

FINDINGS: The right lobe of thyroid gland measures 58 x 18 x 23 mm. The isthmus measures 3 mm. The left lobe of thyroid gland measures 43 x 20 x 22 mm. 

Right lobe of thyroid gland:

1. 7 x 5 x 7 mm isoechoic solid nodule with punctate calcification. This is classified as a TR4 nodule. No further followup is recommended.

2. 14 x 8 x 13 mm isoechoic solid nodule. This is classified as a TR3 nodule. No further followup is recommended.

Left lobe of thyroid gland:

1. Solid, very hypoechoic nodule measuring 25 x 15 x 23 mm. There is a lobulated border. Microcalcification and punctate echogenic foci are present. This is classified as a TR5 nodule. Recommend ultrasound-guided fine-needle aspiration biopsy.

Soft tissue neck: There is a heterogeneous subcutaneous lesion anterior to the thyroid gland in the right neck at level VI measuring 6 x 5 x 6 mm.
IMPRESSION: 1. 25 x 15 x 23 mm left lobe of thyroid gland nodule, described as above. This is classified as a TR5 nodule. Recommend ultrasound-guided fine-needle aspiration.

2. There is a hypoechoic lesion identified in the superficial soft tissue anterior to the right thyroid gland at levels VI. This is of unknown clinical significance. Clinical correlation is recommended.

ACR TI-RADS recommendations:

TR5 (>= 7 points) - FNA if > 1 cm, follow up if 0.5-0.9 cm every year for 5 years

TR4 (4-6 points) - FNA if >=1.5 cm, follow up if 1-1.4 cm in 1, 2, 3, and 5 years

TR3 (3 points) - FNA if 2.5 cm, follow up if 1.5-2.4 cm in 1, 3, and 5 years

TR2  (2 points) & TR1 (0 points) - No FNA or followup.

Amazigh, Quirijn, et al.  ACR Thyroid Imaging, Reporting and Data System (TI-RADS):  White Paper of the ACR TI-RADS Committee. J Am Coll Radiology 6383, 14(5) 587-595.

IMPORTANT FINDINGS!

## 2020-03-21 IMAGING — US DOP VENOUS LWR EXT BIL
1 series · 14 of 24 positions shown · non-contrast
Comparison: None.

HISTORY: 73-year-old female with localized edema. Patient on Xarelto following recent orthopedic surgery.
TECHNIQUE: Real-time duplex sonography of the deep veins of the bilateral lower extremities with color and spectral Doppler, with and without compression.

[Series 1: dop venous lwr ext bil · portal-venous · 14 of 53 slices shown]
[im 1/53]
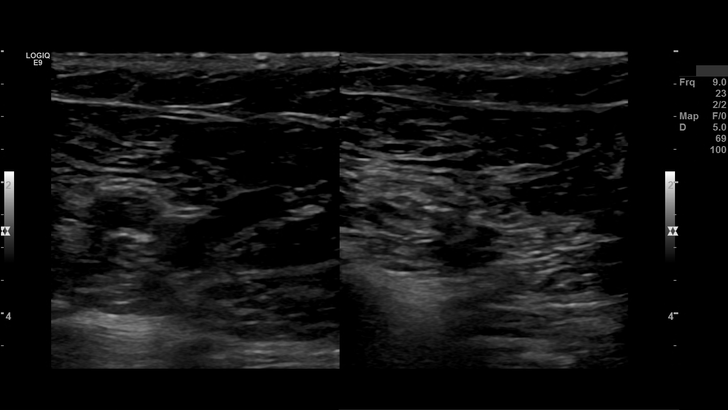
[im 5/53]
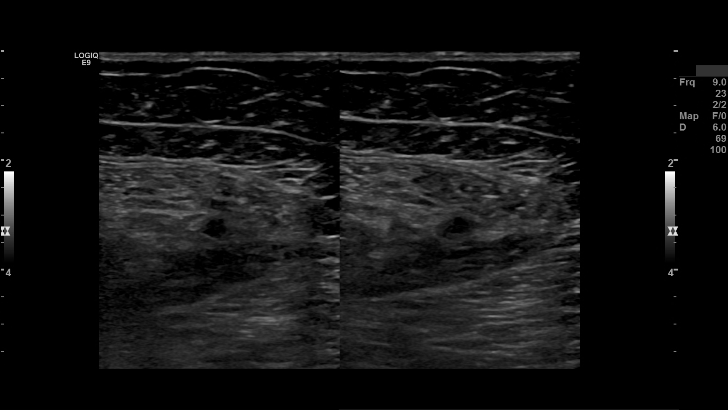
[im 10/53]
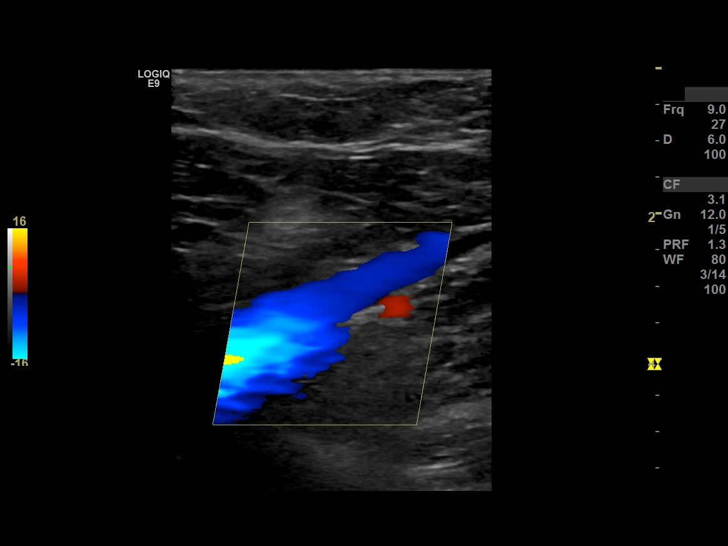
[im 14/53]
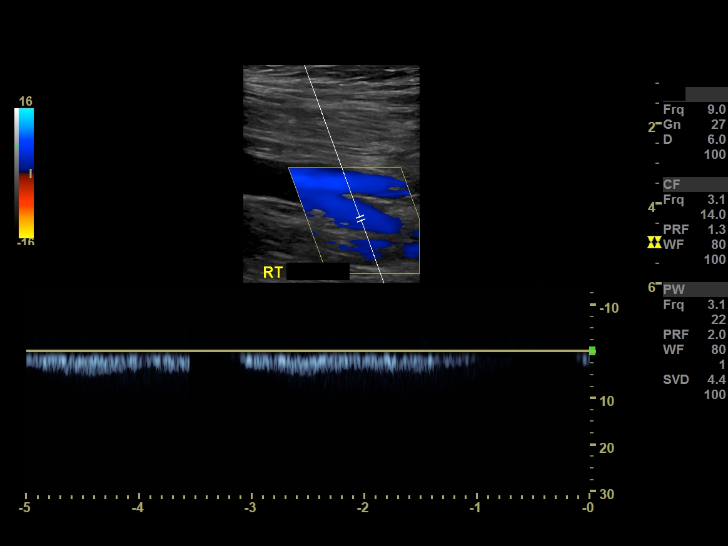
[im 16/53]
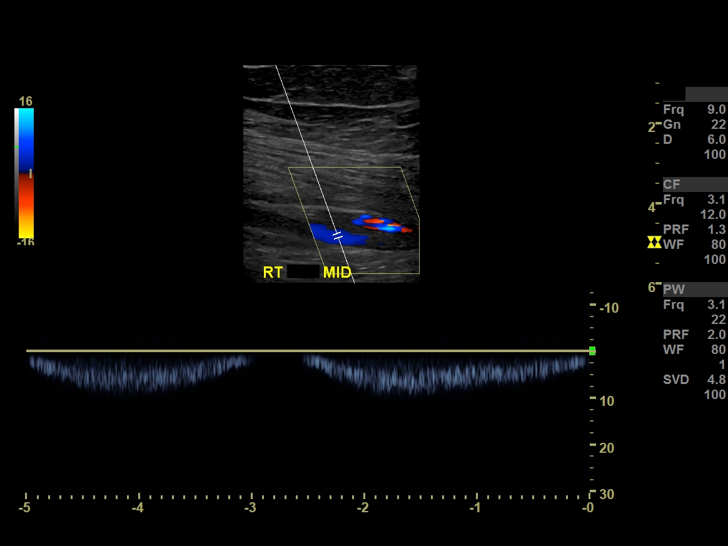
[im 21/53]
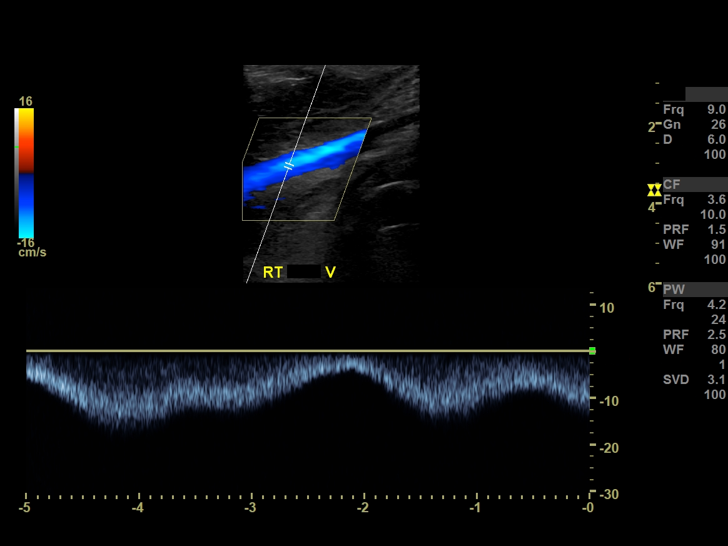
[im 25/53]
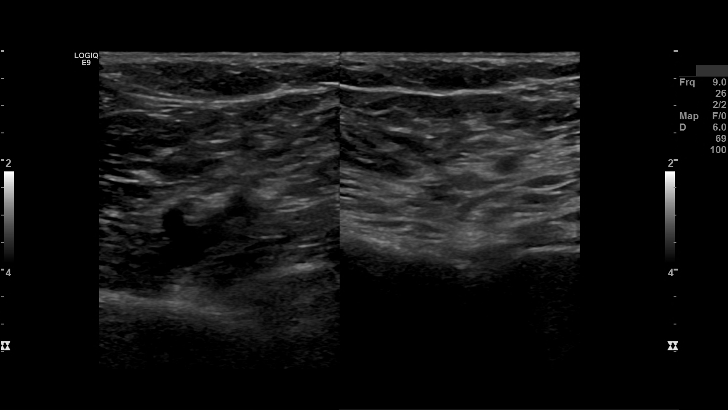
[im 28/53]
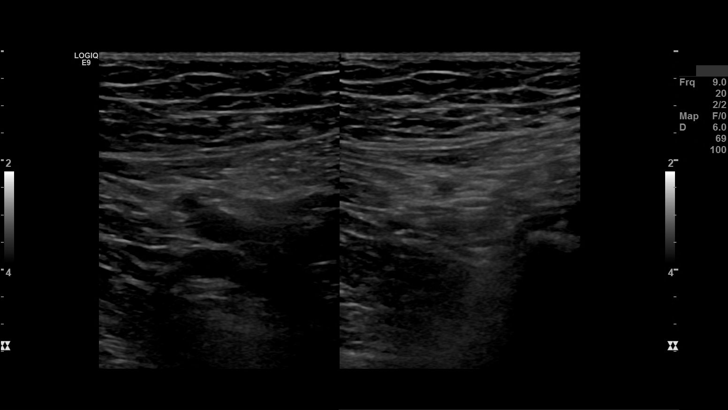
[im 32/53]
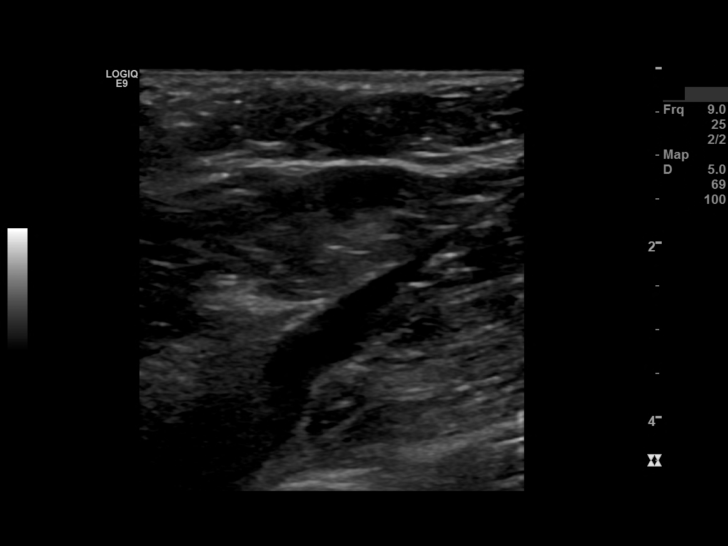
[im 37/53]
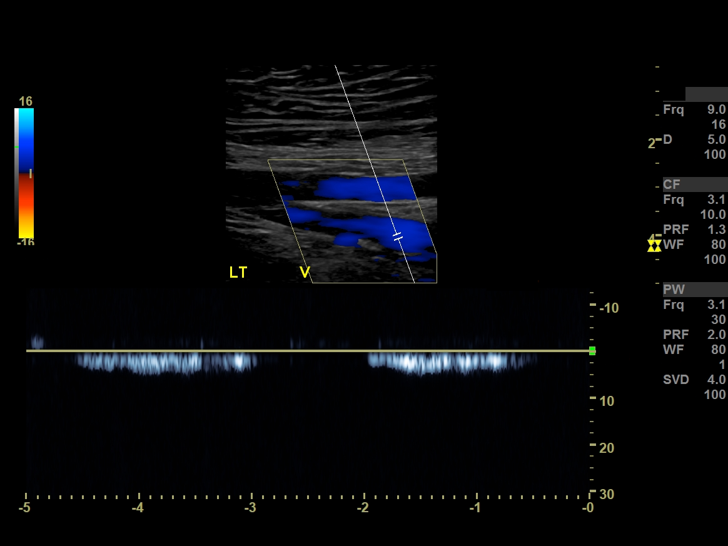
[im 41/53]
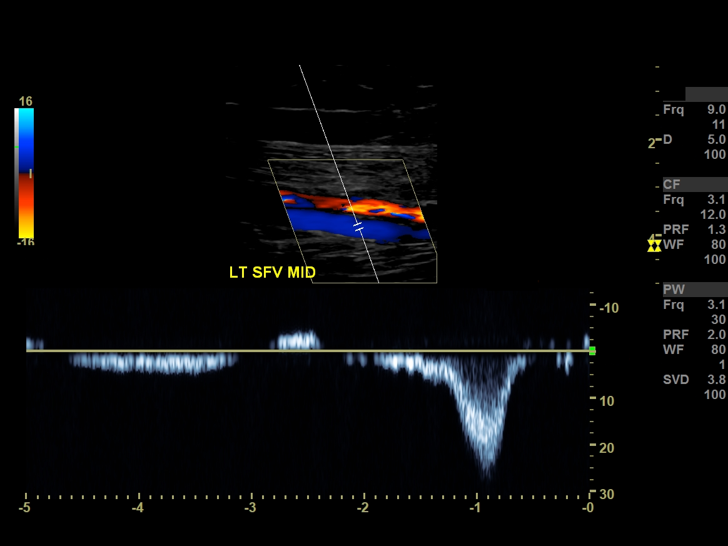
[im 43/53]
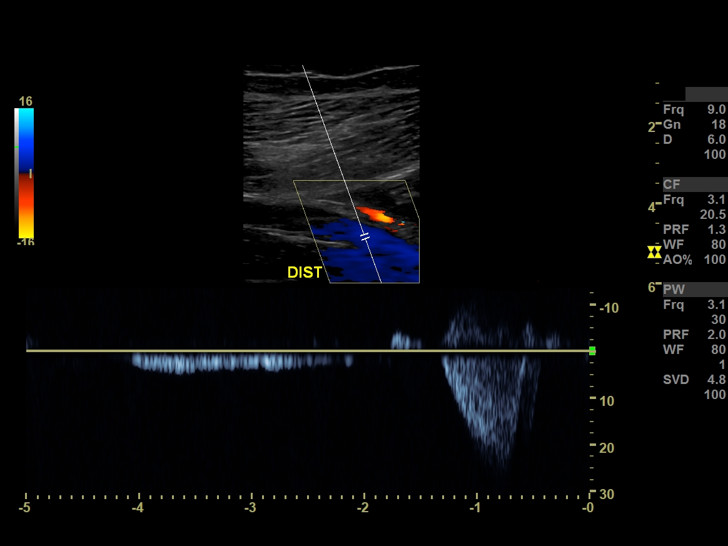
[im 48/53]
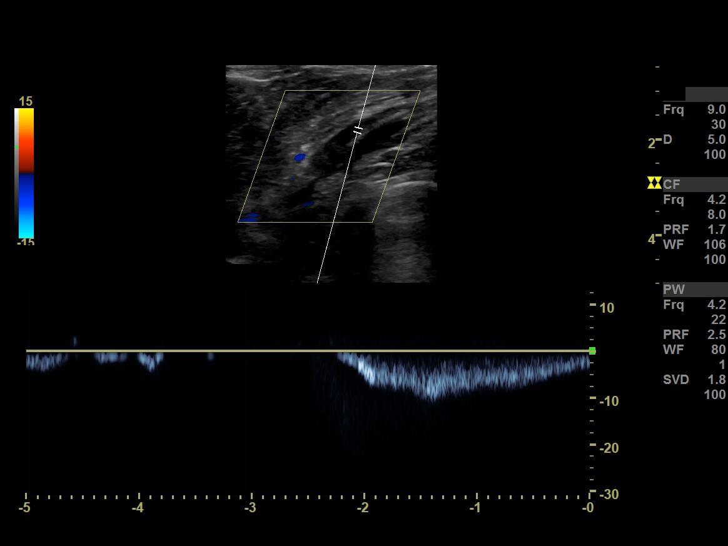
[im 53/53]
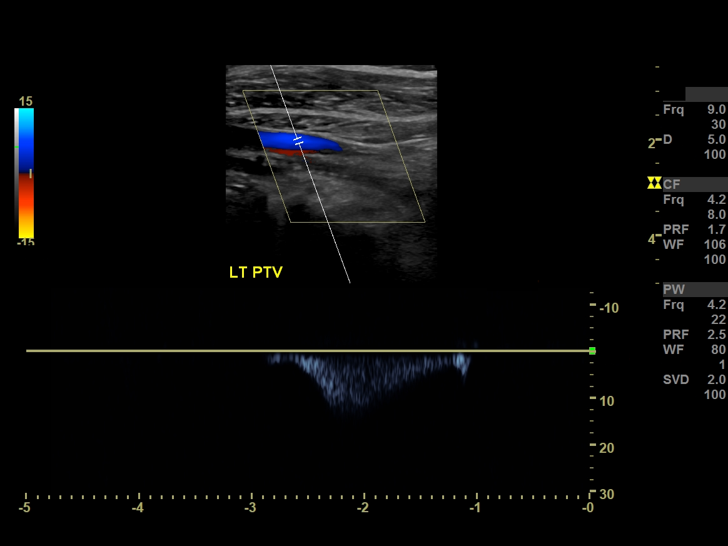

[14 of 24 positions shown; findings below may reference images not displayed]

FINDINGS: Image quality is Satisfactory.

RIGHT: 

COMMON FEMORAL/GREAT SAPHENOUS VEIN: 

Compressible, patent with normal color and spectral flow with appropriate response to augmentation.

FEMORAL VEIN/PROFUNDA: 

The midportion of the right femoral vein does not fully compress. There is persistent flow and normal spectral waveform.

POPLITEAL VEIN: 

Compressible, patent with normal color and spectral flow with appropriate response to augmentation.

ANTERIOR TIBIAL VEIN: 

Patent with normal color and spectral flow with appropriate response to augmentation.

POSTERIOR TIBIAL VEIN: 

Patent with normal color and spectral flow with appropriate response to augmentation.

PERONEAL VEIN: 

Patent with normal color and spectral flow with appropriate response to augmentation.

LEFT: 

COMMON FEMORAL/GREAT SAPHENOUS VEIN: 

Compressible, patent with normal color and spectral flow with appropriate response to augmentation.

FEMORAL VEIN/PROFUNDA: 

Compressible, patent with normal color and spectral flow with appropriate response to augmentation.

POPLITEAL VEIN: 

Compressible, patent with normal color and spectral flow with appropriate response to augmentation.

ANTERIOR TIBIAL VEIN: 

Patent with normal color and spectral flow with appropriate response to augmentation.

POSTERIOR TIBIAL VEIN:

 Patent with normal color and spectral flow with appropriate response to augmentation.

PERONEAL VEIN: 

Patent with normal color and spectral flow with appropriate response to augmentation.
IMPRESSION: Nonocclusive, likely chronic thrombus is noted in the midportion of the right femoral vein.

STAT Fax

IMPORTANT FINDING!

## 2020-08-27 IMAGING — US BX THYROID FNA LT
1 series · 14 of 14 positions shown · non-contrast
Comparison: Thyroid ultrasound, 02/16/2020.

INDICATION: Thyroid nodule.

[Series 1: bx thyroid fna left · 14 of 14 slices shown]
[im 1/14]
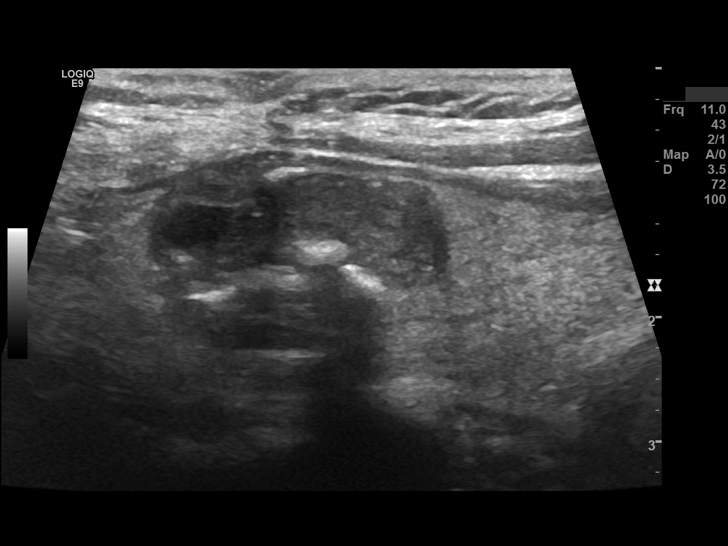
[im 2/14]
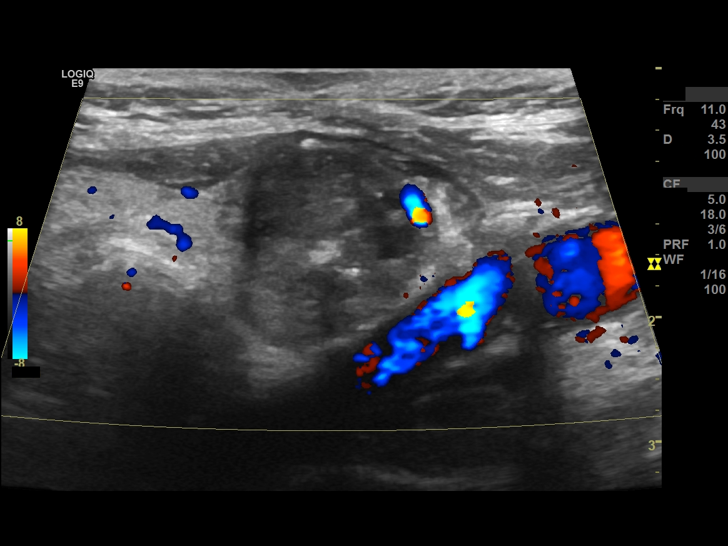
[im 3/14]
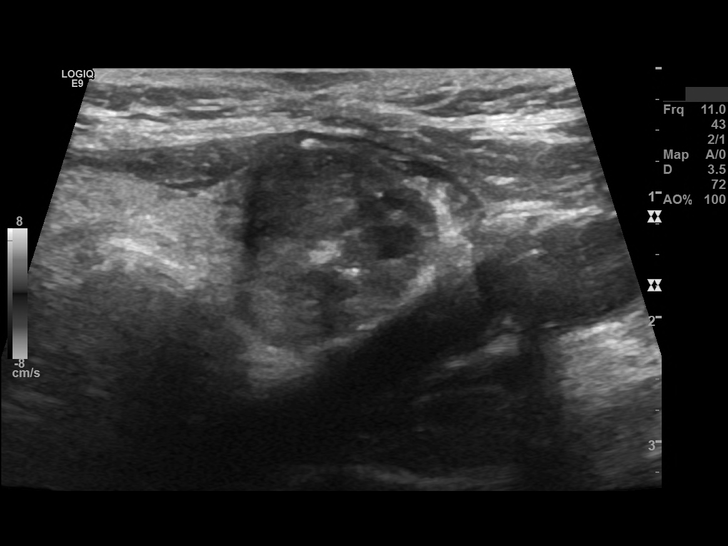
[im 4/14]
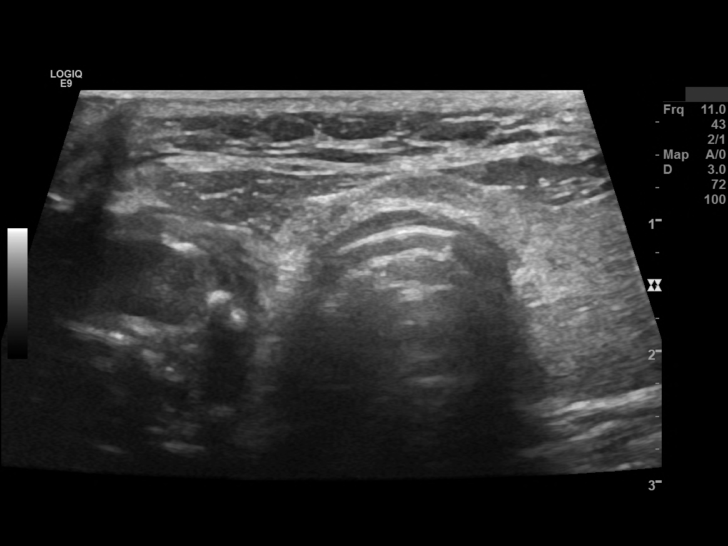
[im 5/14]
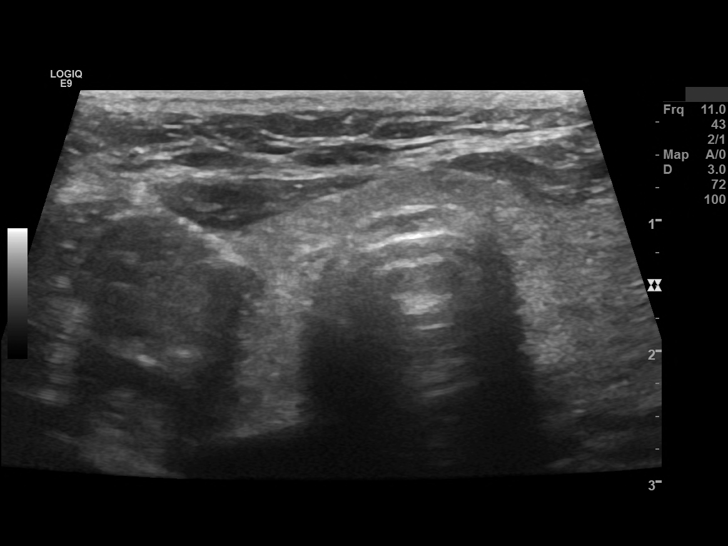
[im 6/14]
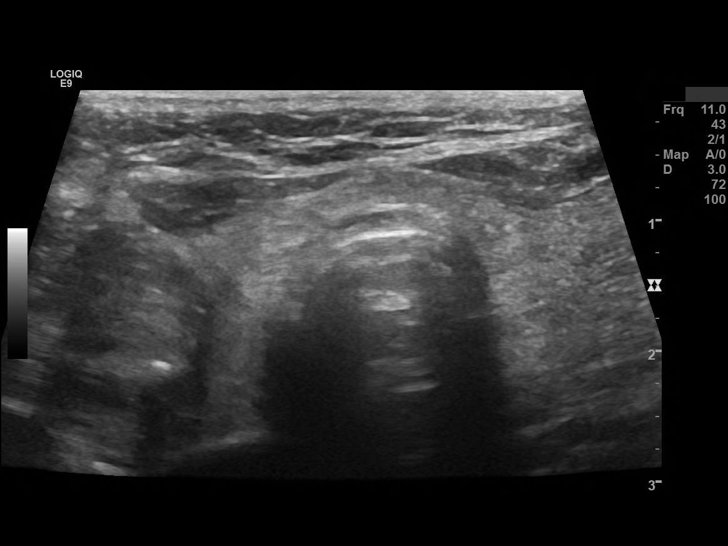
[im 7/14]
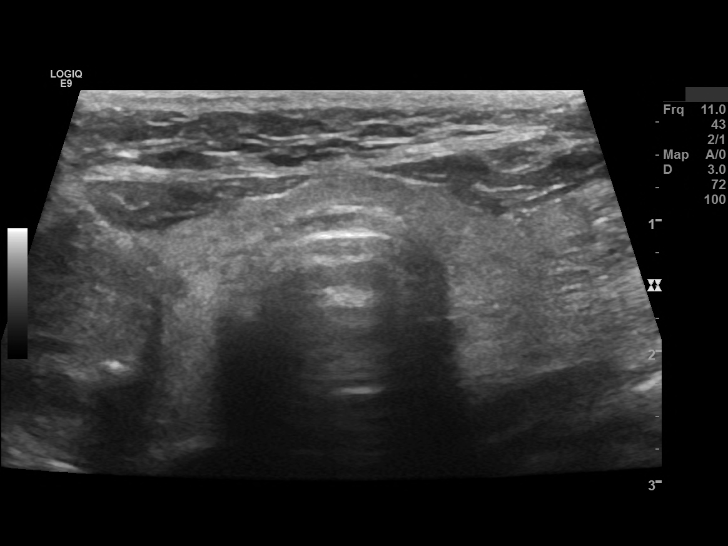
[im 8/14]
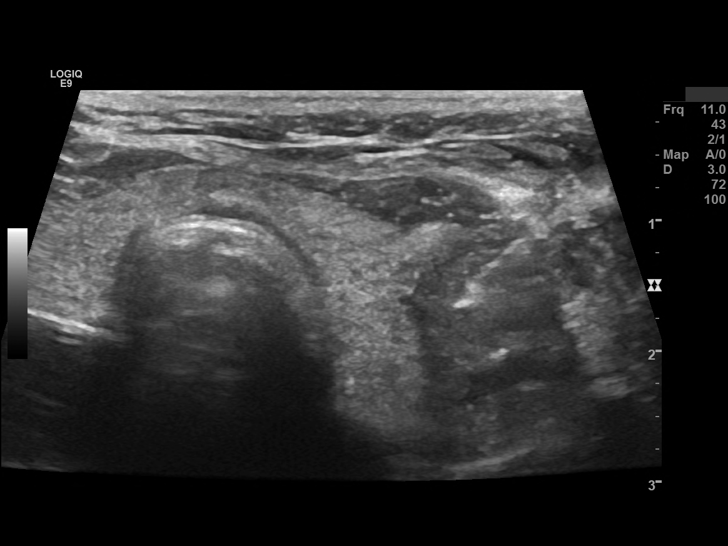
[im 9/14]
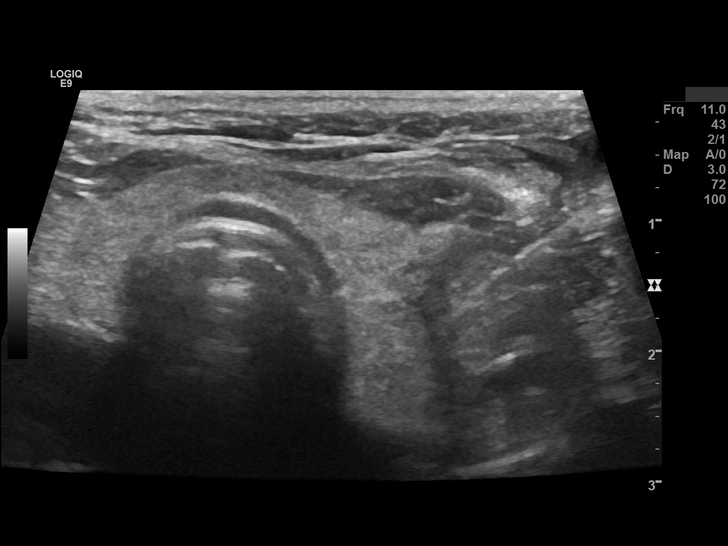
[im 10/14]
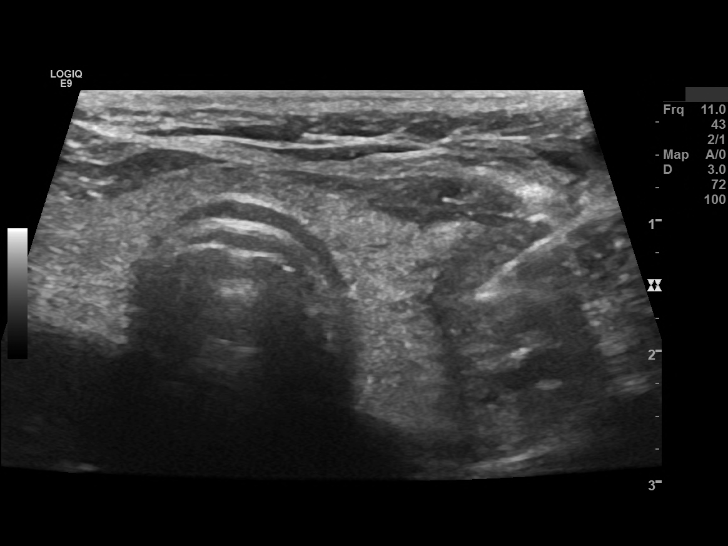
[im 11/14]
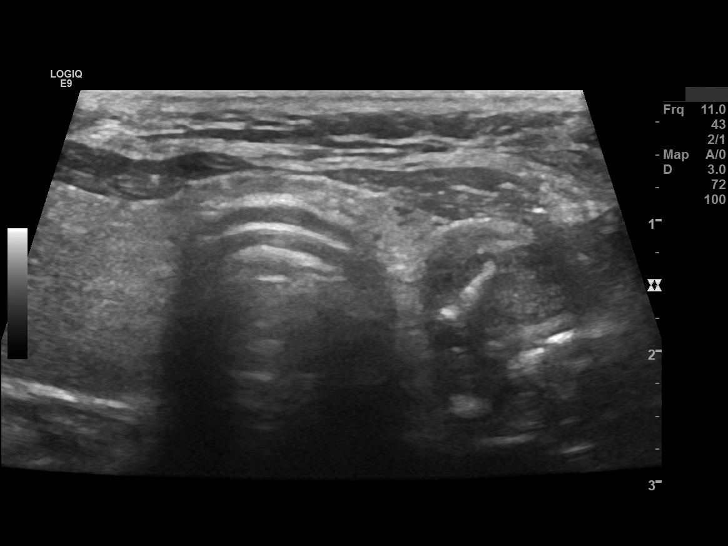
[im 12/14]
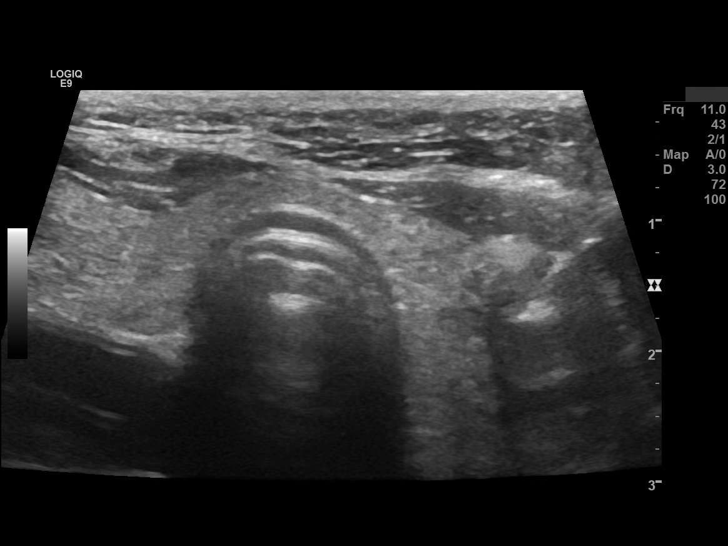
[im 13/14]
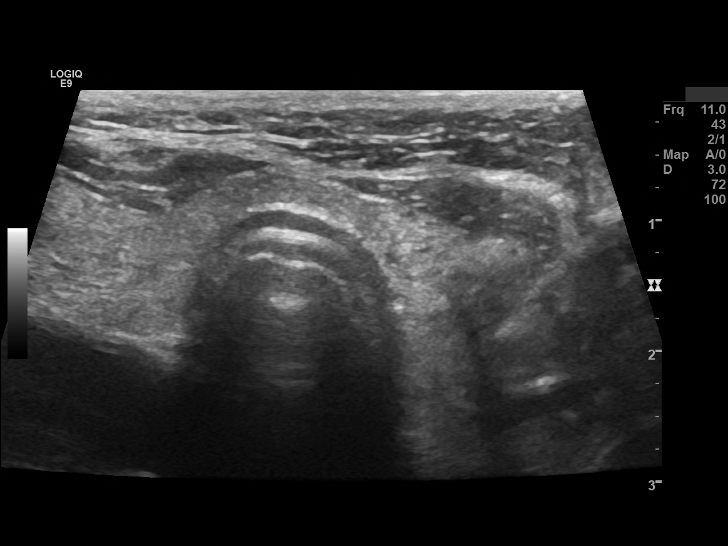
[im 14/14]
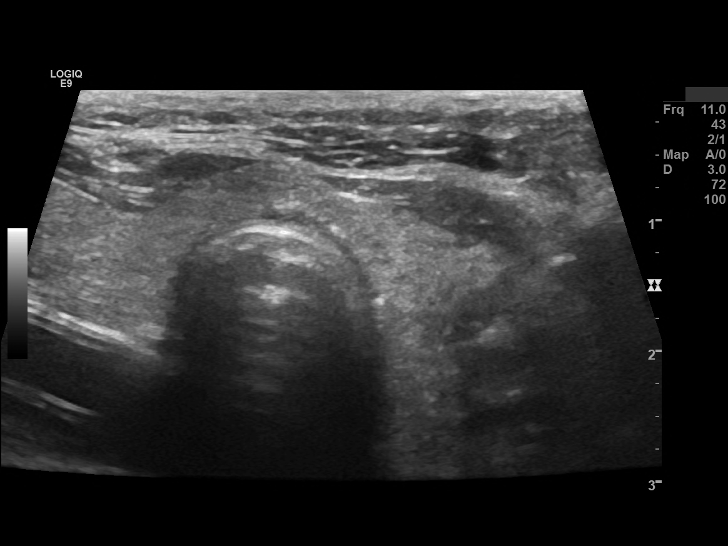

[14 of 14 positions shown; findings below may reference images not displayed]

PROCEDURE:  Ultrasound-guided biopsy of left thyroid nodule.

DESCRIPTION OF PROCEDURE:

After discussion of the risks and benefits of the procedure, including nondiagnostic sample, bleeding and infection, informed consent was obtained. The patient placed supine on the ultrasound table. Timeout was performed. Initial ultrasound was performed and the requested left thyroid nodule were localized under ultrasound guidance. Skin entry site over the left thyroid lobe was then marked under ultrasound guidance. The patient was then prepped and draped in the usual sterile fashion including sterile ultrasound probe covering. Local anesthesia was achieved using a small amount of 1% lidocaine. 3 passes with a 25-gauge hypodermic needle were performed. The specimens were placed in wet prep and cell block. The patient tolerated the procedure well without immediate complication.

Images were archived in PACS.
IMPRESSION: Ultrasound-guided biopsy of the requested left thyroid nodule, as described above.

## 2020-09-06 IMAGING — CT CT NECK SOFT TISSUE WO/W CONTRAST
4 of 6 series · 13 of 33 positions shown, 15 images · non-contrast
Comparison: December 06, 2019.

REASON FOR EXAM: Thyroid mass.

[Series 3: pre contrast (person_name) · axial · non-contrast · 0.36mm/px · z∈[-1046,-918]mm · 3 of 128 slices shown, 4 images]
[im 32/128  soft-tissue]
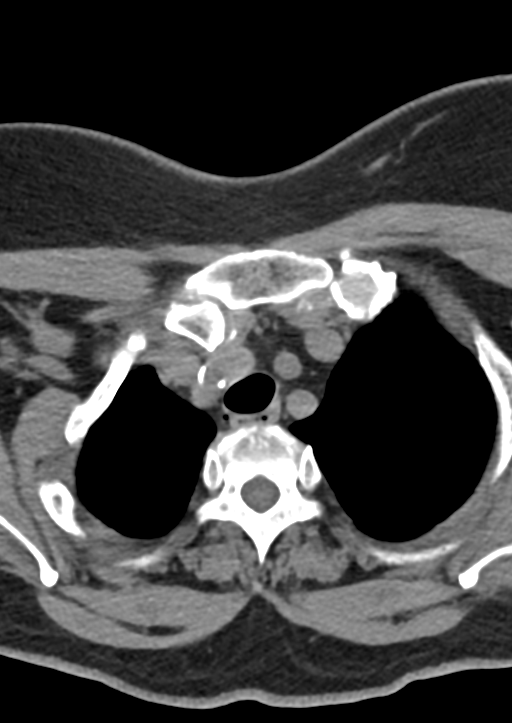
[im 32/128  bone]
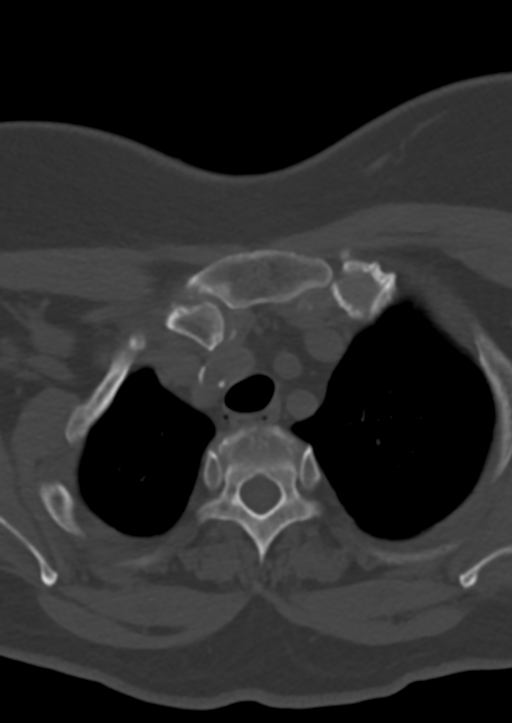
[im 64/128  bone]
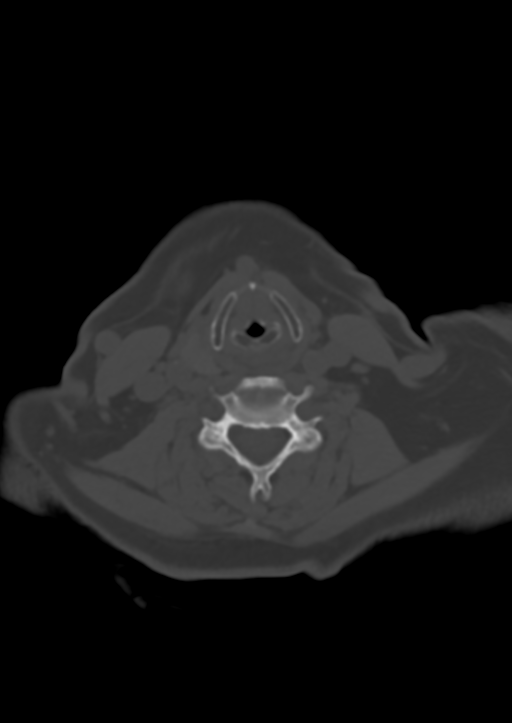
[im 96/128  bone]
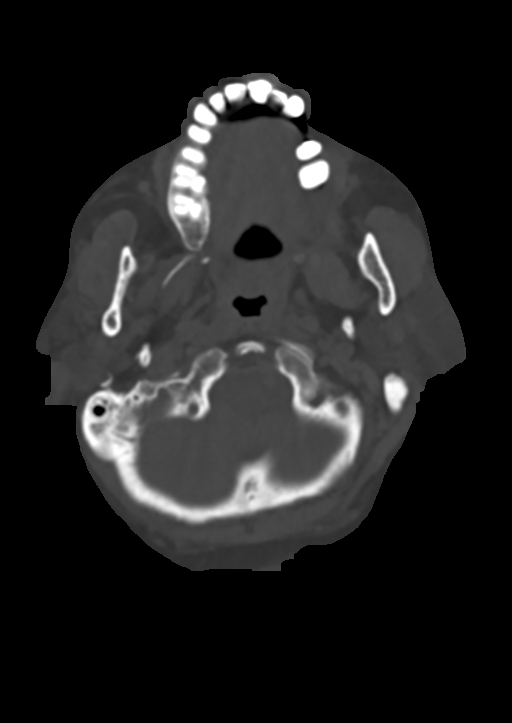

[Series 5: soft tissue (person_name) · axial · 0.36mm/px · z∈[-1046,-982]mm · 2 of 128 slices shown]
[im 32/128  soft-tissue]
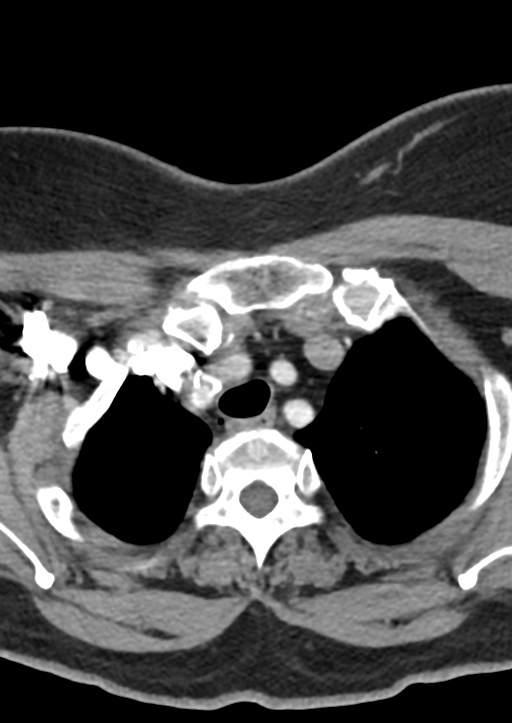
[im 64/128  soft-tissue]
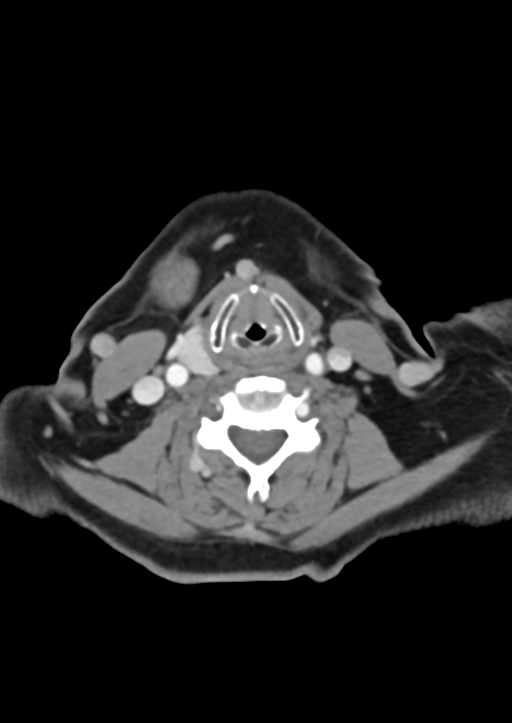

[Series 7: coronal (person_name) · coronal · 0.36mm/px · 3 of 115 slices shown]
[im 23/115  bone]
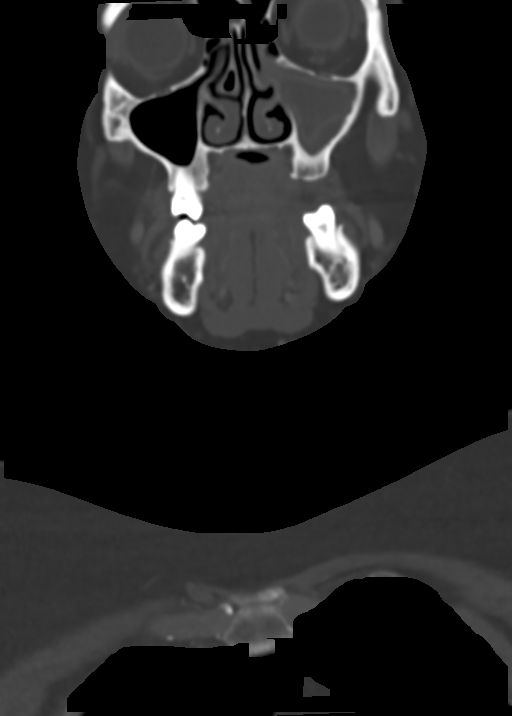
[im 46/115  bone]
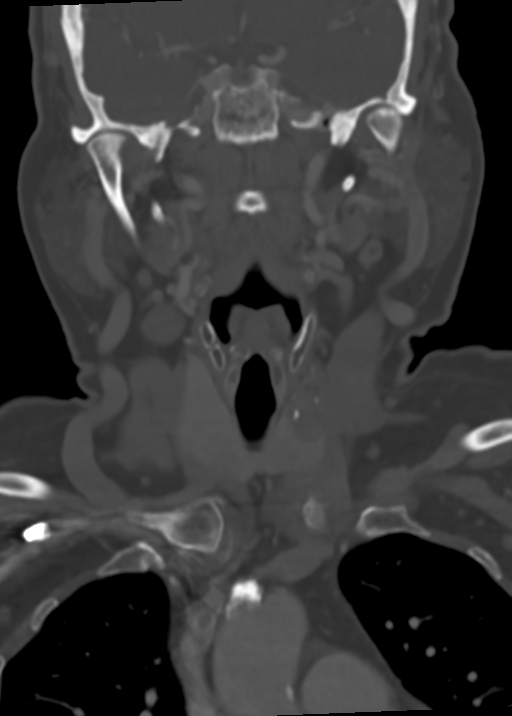
[im 69/115  bone]
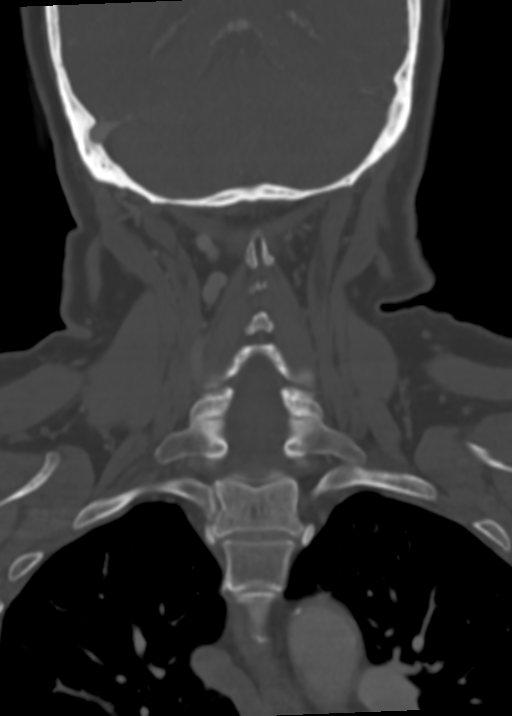

[Series 9: sagittal (person_name) · sagittal · 0.50mm/px · 5 of 90 slices shown, 6 images]
[im 30/90  bone]
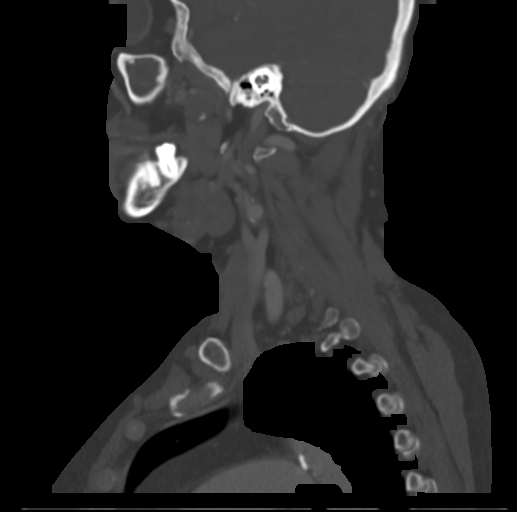
[im 38/90  bone]
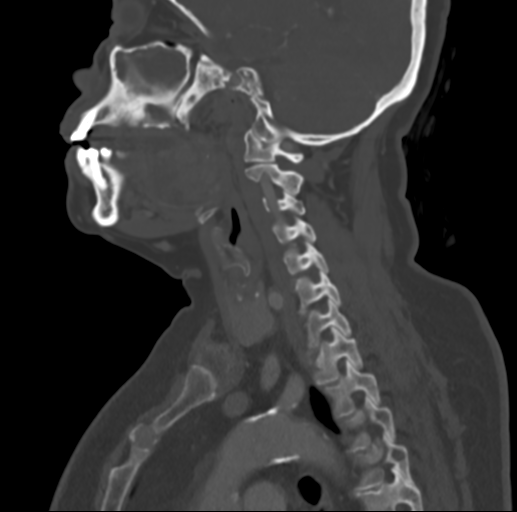
[im 45/90  soft-tissue]
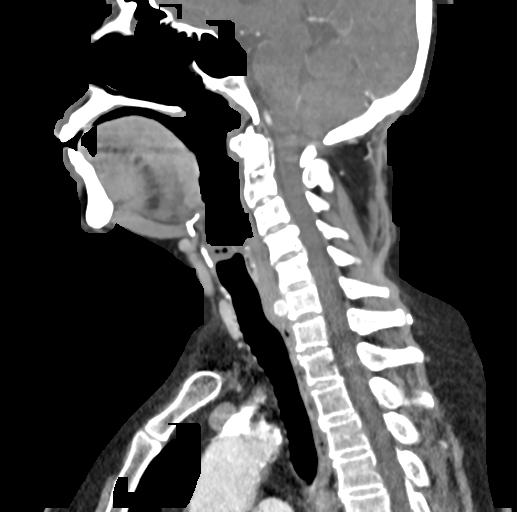
[im 45/90  bone]
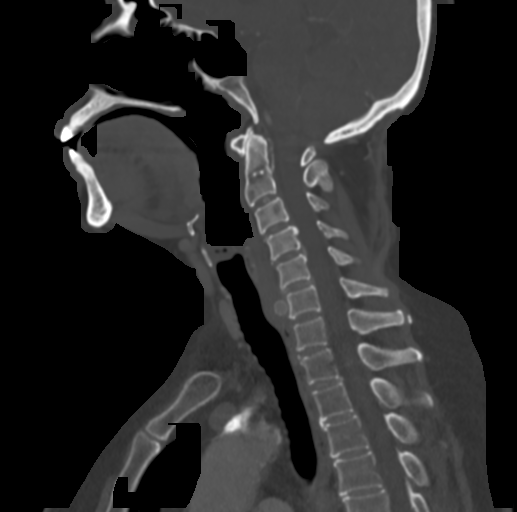
[im 52/90  bone]
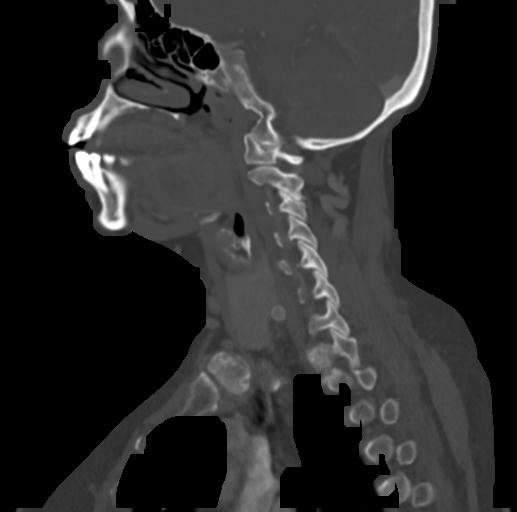
[im 60/90  bone]
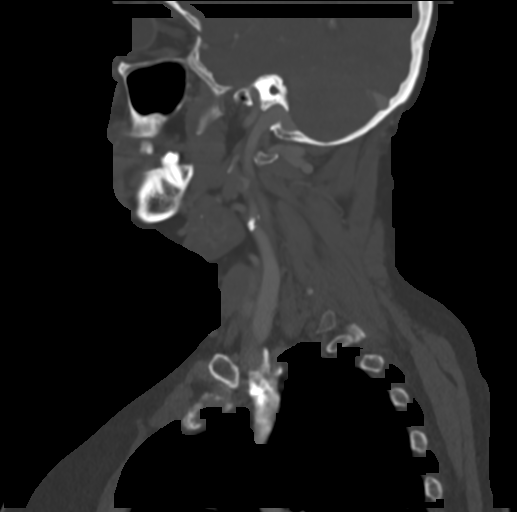

[13 of 33 positions shown; findings below may reference images not displayed]

TECHNIQUE AND FINDINGS: Axial CT images were obtained to the neck pre- and post 100 mL of Zsovue-IHH. Serum creatinine 0.4. Coronal and sagittal reconstructions were obtained.

Right lobe of the thyroid is mildly enlarged measuring 5.5 cm long axis. It has a mildly heterogeneous enhancement with 2 or 3 small nodules identified and a small calcification. Left lobe of the thyroid measures approximately 4 cm long axis. It contains a hypodense 2.4 cm nodule which shows scattered internal calcification. The size and appearance of this nodule is unchanged from previous exam. No obvious extrathyroidal extension is identified. There are no abnormally enlarged lymph nodes appreciated.

Vascular structures are remarkable for dense atherosclerotic calcification at the origin of the brachiocephalic artery. At the level of the thyroid, there is a retroesophageal left to right carotid bypass graft which is widely patent including both anastomoses. Vascular structures above this point enhance appropriately. There is some right carotid bifurcation atherosclerotic calcification but it does not appear to yield a significant stenosis.

The lung apices are clear. The pharynx is normal mucosal space. The glottis is symmetrical. Epiglottis has a normal configuration. Subcutaneous tissues are unremarkable. Musculoskeletal system is unremarkable.
IMPRESSION: 1. Stable appearance to left thyroid nodule. Multiple small right thyroid nodules.

2. Patent retroesophageal left to right carotid bypass graft for brachiocephalic occlusion.

Total radiation dose to patient is CTDIvol 28.57 mGy and DLP 817.18 mGy-cm.

## 2021-07-08 IMAGING — OT DXA BONE DENSITY
2 series · 2 of 2 positions shown · non-contrast
Comparison: none

REASON FOR EXAM: Postmenopausal screening for osteoporosis

RISK FACTORS:  Diabetes (oral meds) increases patient risk of secondary osteoporosis
PRIOR EXAMS:  None
METHOD:  Scans of the spine between L1-L4 and hip were performed using dual energy X-ray densitometry (DXA)

[Series 1: — · 1 of 1 slices shown (1 of 2)]
[im 1/1]
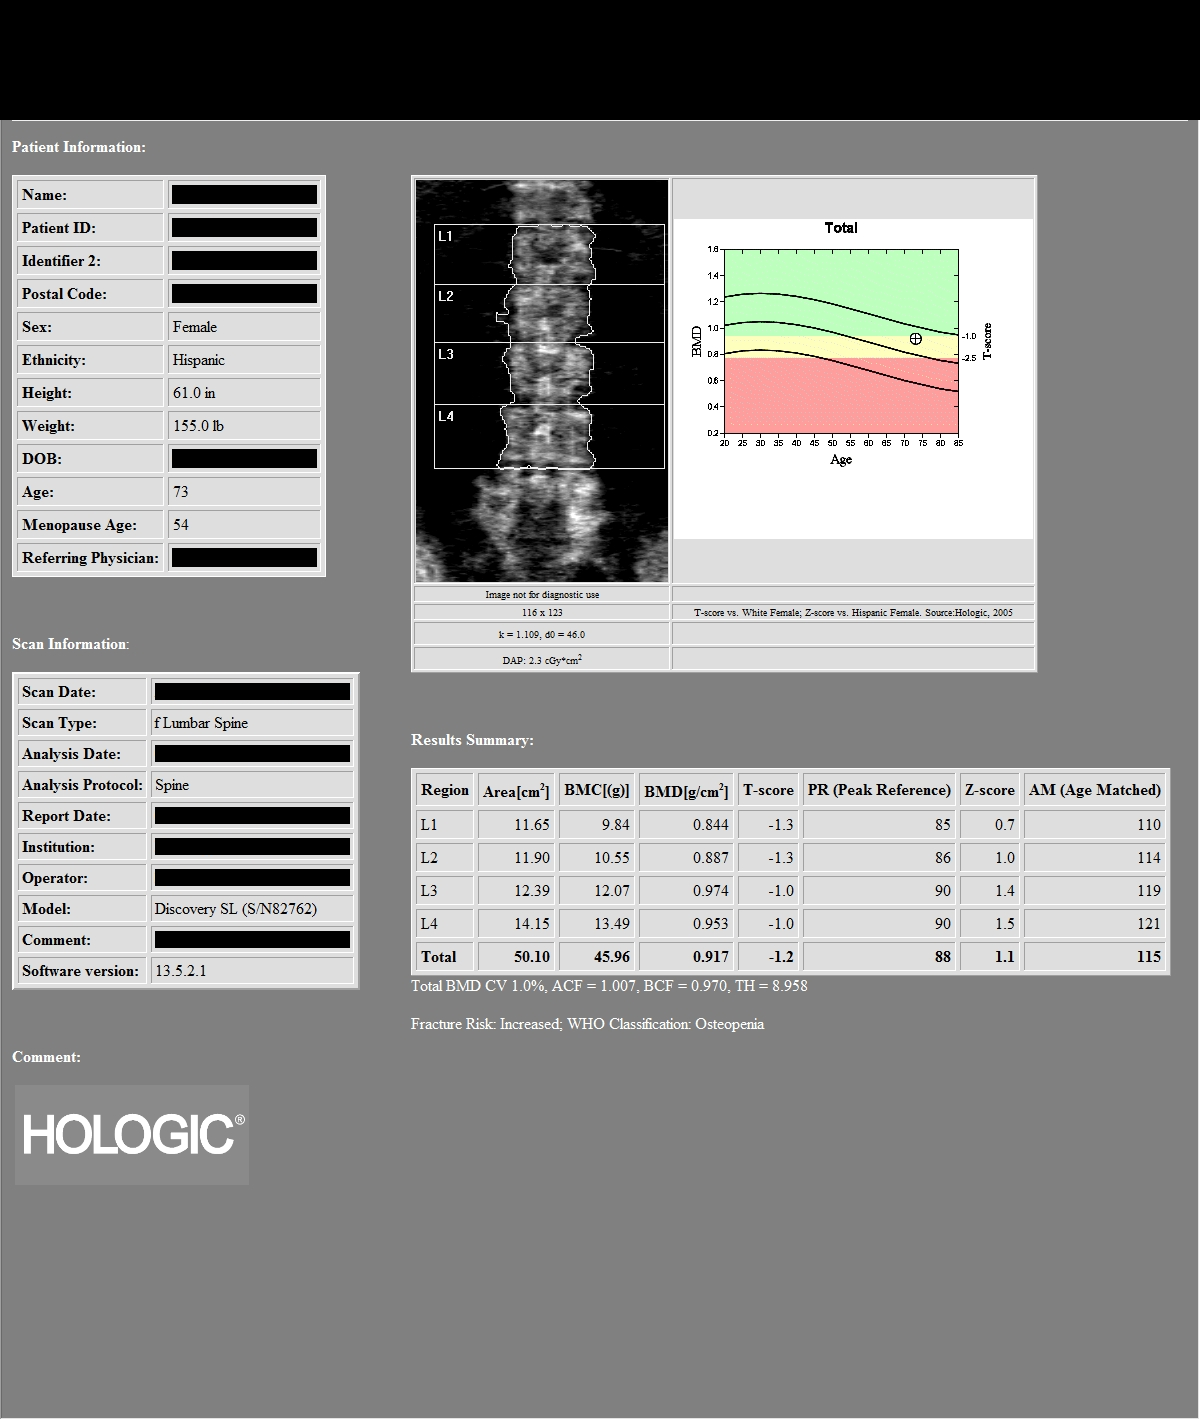

[Series 2: — · left · 1 of 1 slices shown (2 of 2)]
[im 1/1]
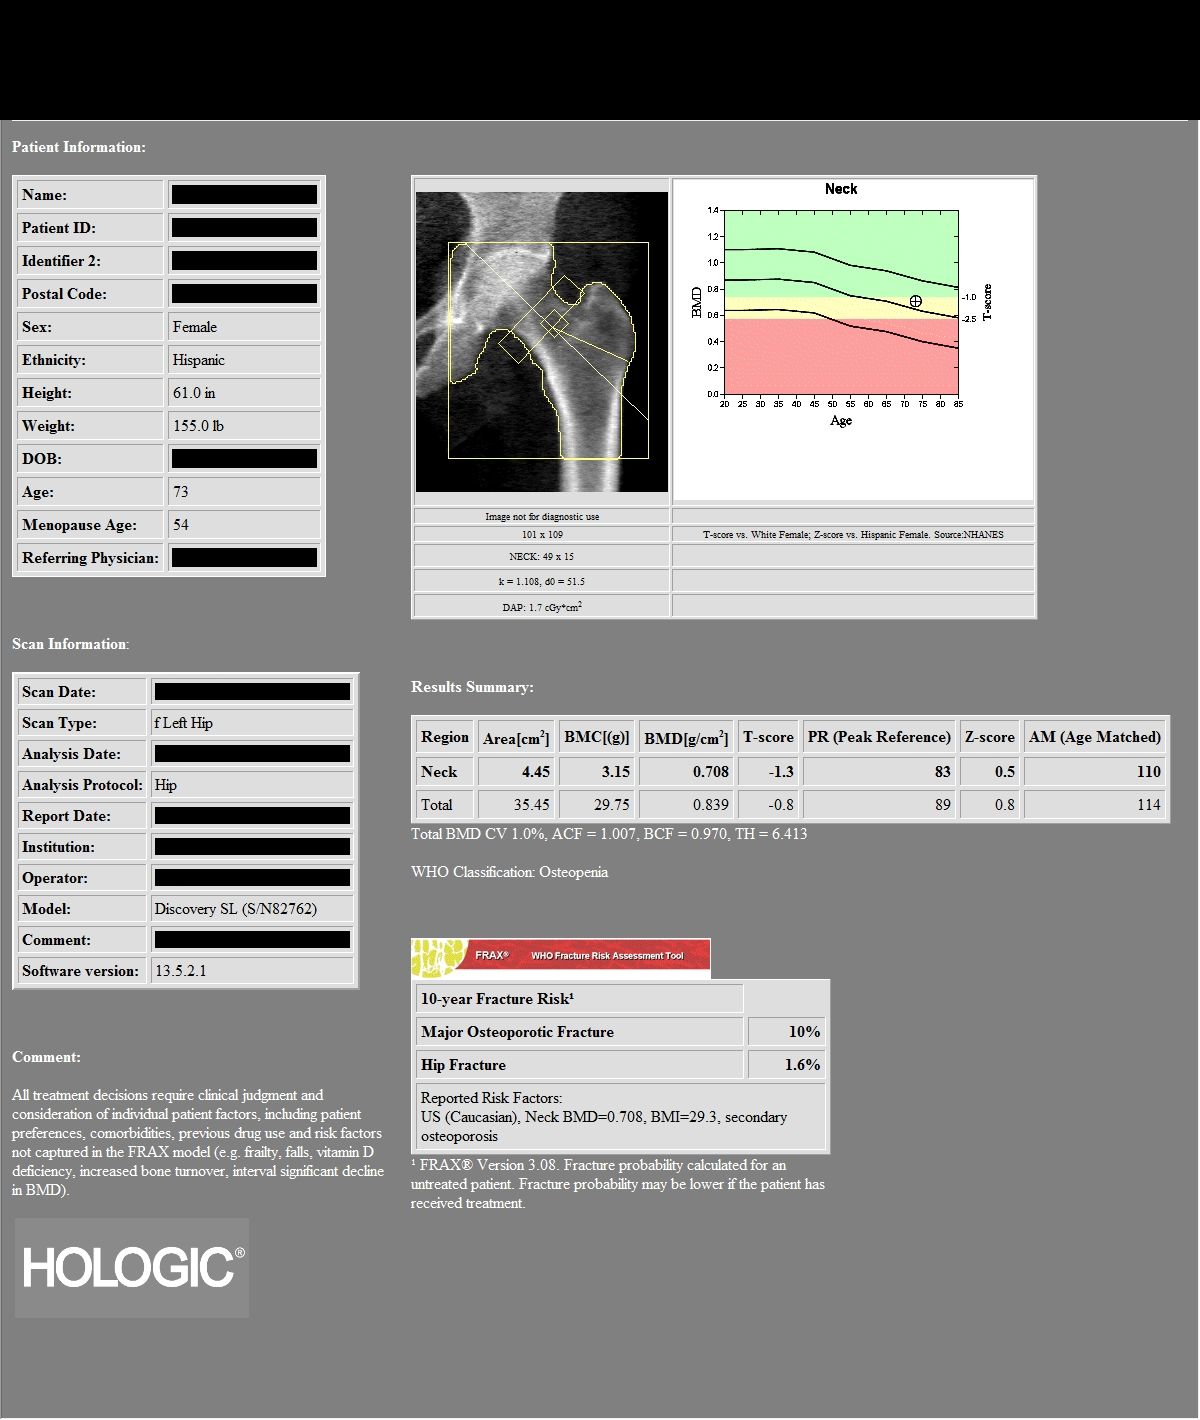

[2 of 2 positions shown; findings below may reference images not displayed]

IMPRESSION: As defined by World Health Organization, the patient meets the criteria for OSTEOPENIA based on spine and hip T-scores.

PATIENT DEMOGRAPHICS:  75-year-old Hispanic female.
FINDINGS: 1.    Review of scanogram images shows no factor invalidating scan results.  

2.    The lumbar spine exam using L1-L4 regions shows average Bone Mineral Density is 0.917 gm/cm2 of Hydroxyapatite.  The T-score (comparing patient with a young adult group) is 1.2 standard deviations BELOW mean. The Z-score (comparing patient with an age-matched group) is 1.1 standard deviations ABOVE mean.

3.  The left hip exam using femoral neck region of interest shows average Bone Mineral Density is 0.708 gm/cm2 of Hydroxyapatite. The T-score (comparing patient with a young adult group) is 1.3 standard deviations BELOW mean. The Z-score (comparing patient with an age-matched group) is 0.5 standard deviations ABOVE mean.

According to the World Health Organization risk assessment tool (FRAX) for osteopenia only, which uses the femoral neck T-score and includes other patient risk factors for fracture, the patient has a 10-year absolute risk of hip fracture of 1.6% and 10-year absolute risk fracture for any major fracture of 10% .

RECOMMENDATIONS:  The patient states that she is not taking supplements on a regular basis.  The patient should continue being a nonsmoker and regular exercise to patient tolerance would be of benefit.  The patient is currently not taking prescribed medication for prevention of bone loss.  According to criteria established by the National Osteoporosis Foundation, the patient DOES NOT meet the current indications for prescribed medical therapy.  The National Osteoporosis Foundation now recommends followup DXA scanning every two years in patients at risk regardless of whether the patient is undergoing pharmacological treatment.

World Health Organization criteria for diagnosis, please see link below.

[URL]

## 2021-07-08 IMAGING — MG MAMMO SCRN BIL W/CAD TOMO
8 series · 8 of 24 positions shown · non-contrast
Comparison: The present examination has been compared to prior imaging studies.

Images Obtained from [facility]
INDICATION: Screening.
TECHNIQUE: Bilateral 2-D digital screening mammogram was performed followed by 3-D tomosynthesis.  Current study was also evaluated with a computer aided detection (CAD) system.
MAMMOGRAM FINDINGS:
There are scattered areas of fibroglandular density.
No suspicious abnormality is seen in either breast.  There are no significant changes from the prior study.

[L CC]
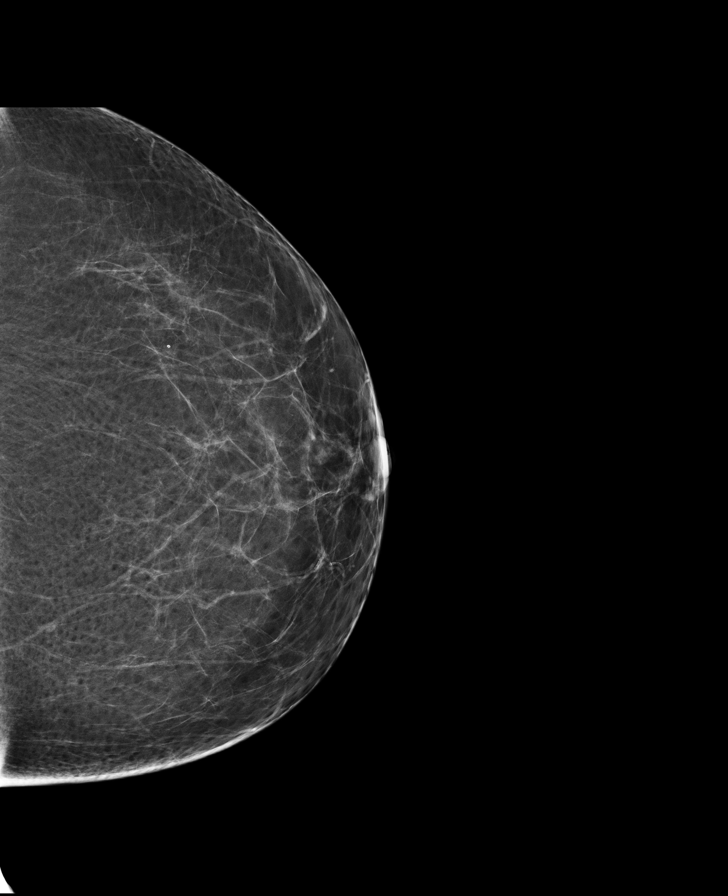

[L MLO]
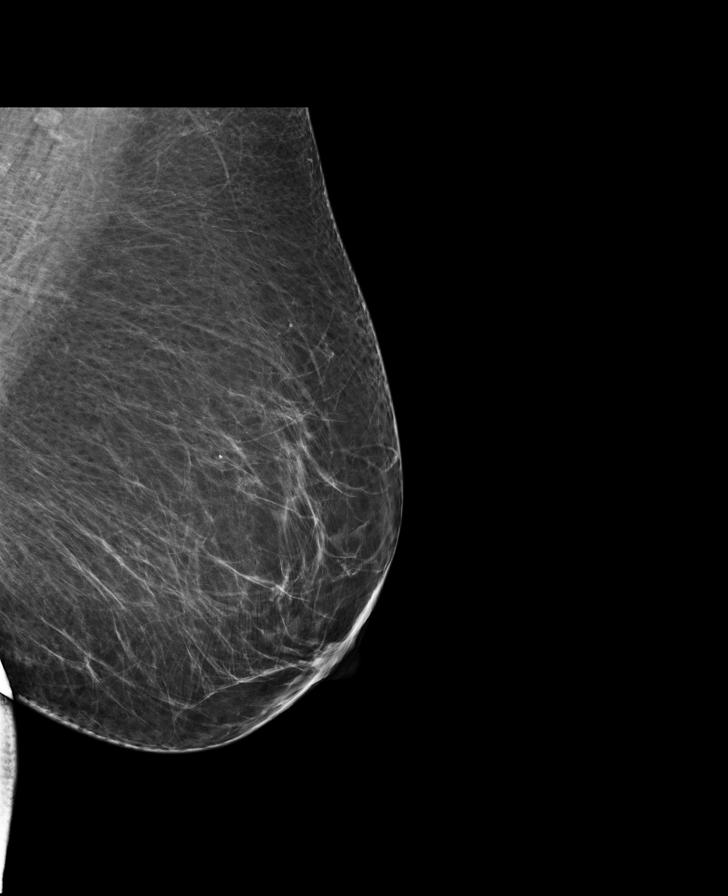

[R CC]
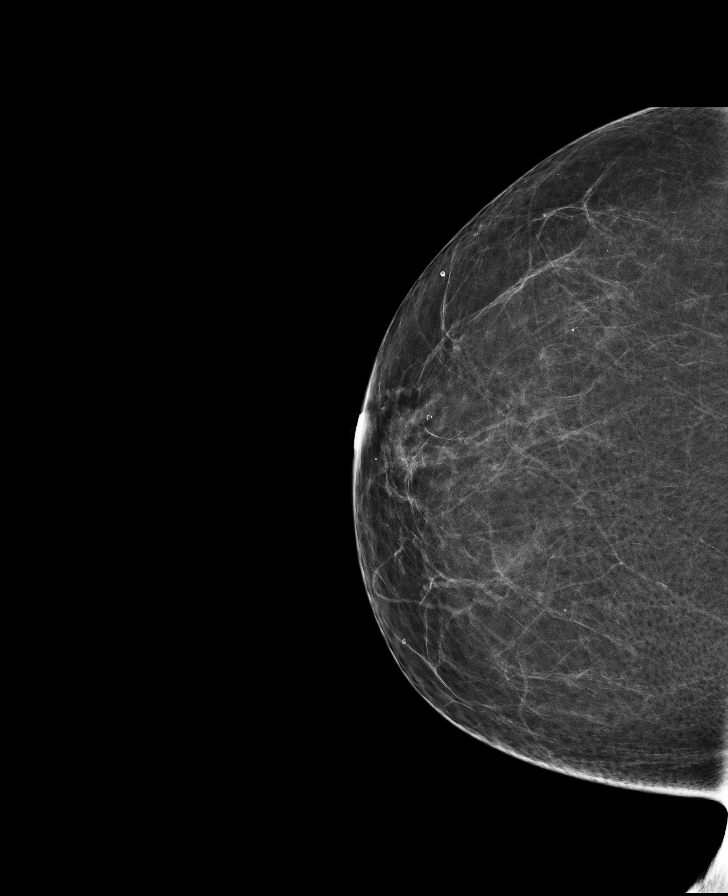

[R MLO]
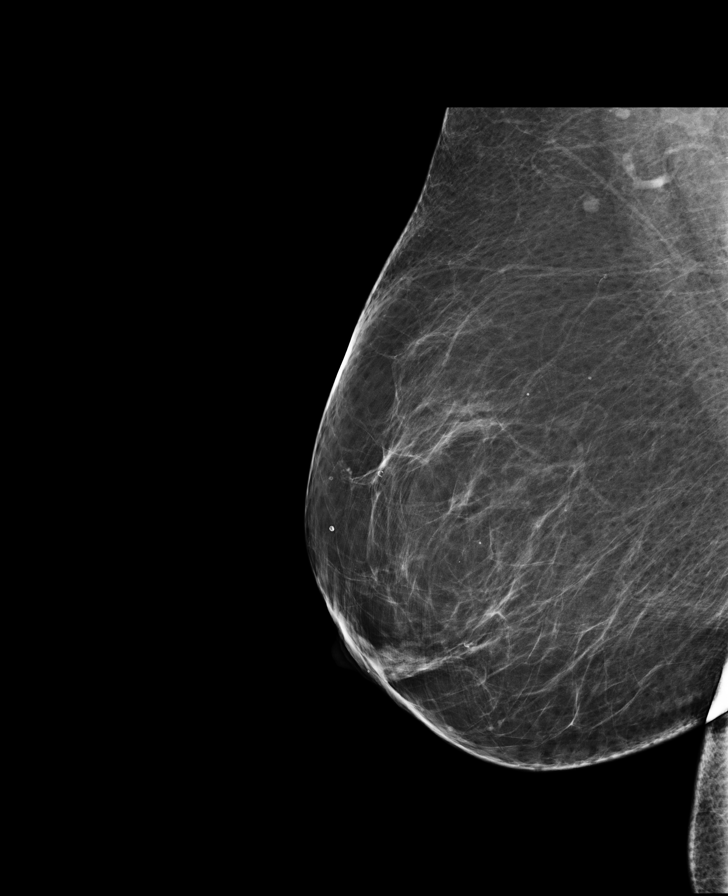

[L MLO tomo · tomo slice 43/86.0]
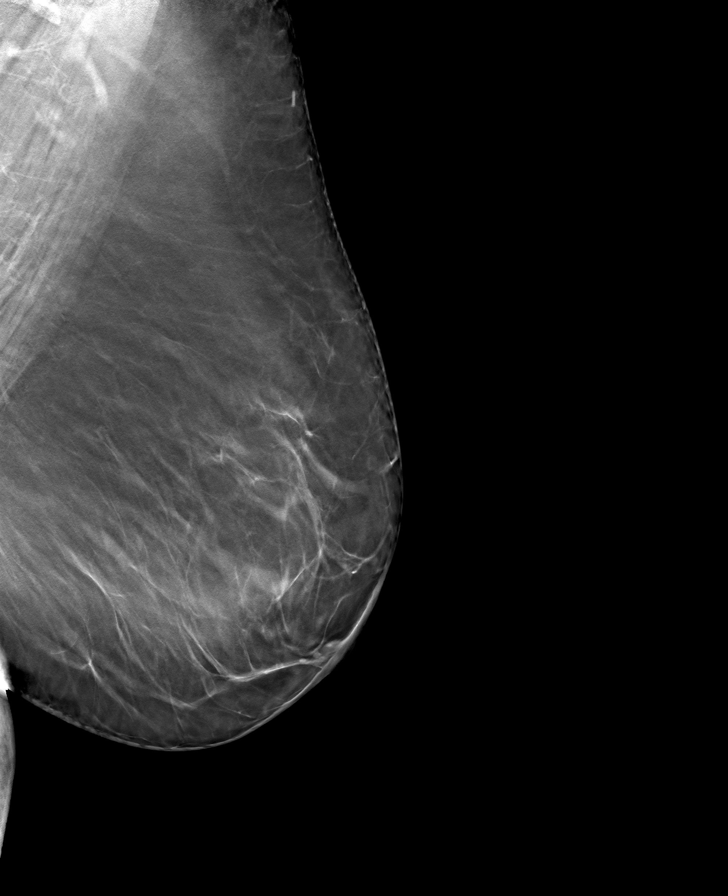

[L CC tomo · tomo slice 36/71.0]
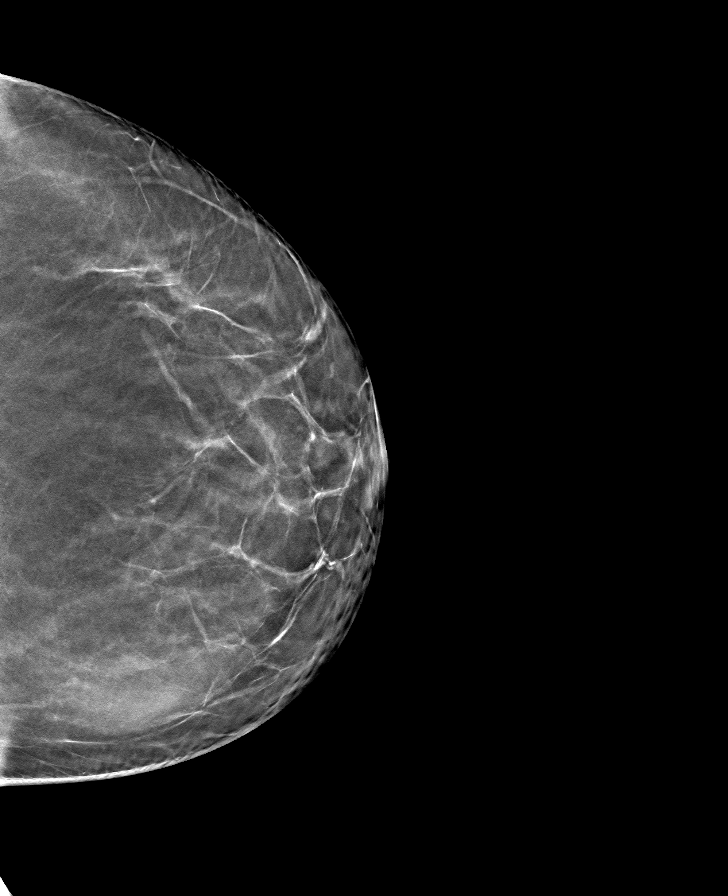

[R CC tomo · tomo slice 36/71.0]
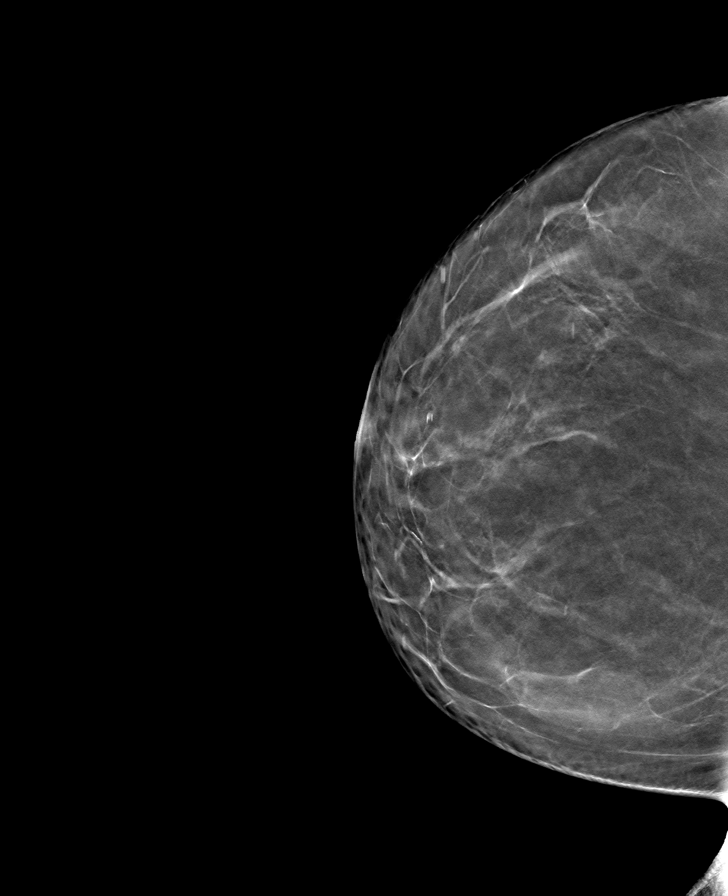

[R MLO tomo · tomo slice 45/90.0]
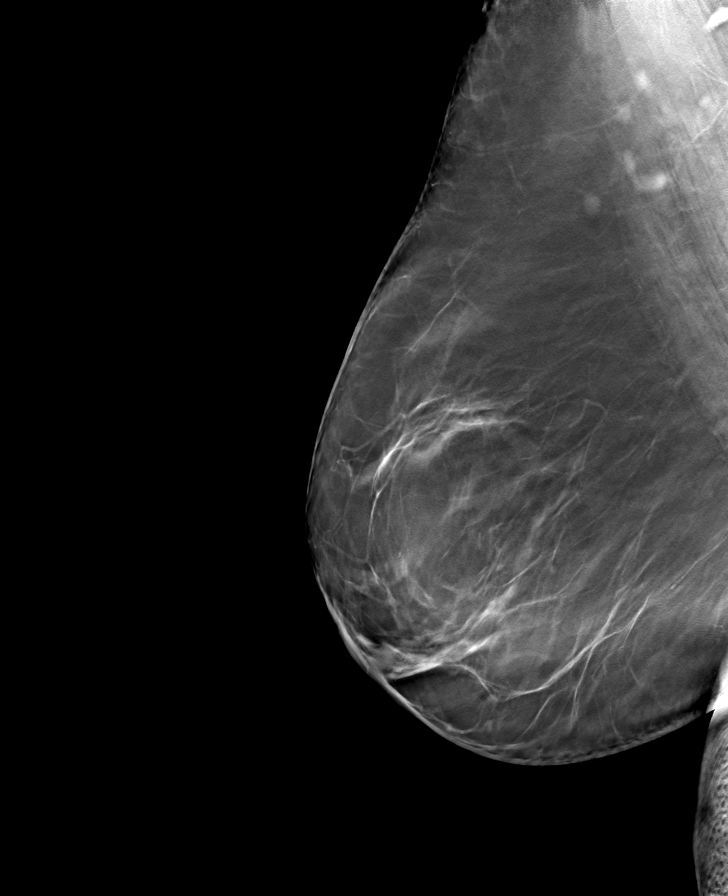

[8 of 24 positions shown; findings below may reference images not displayed]

IMPRESSION: There is no mammographic evidence of malignancy.
Screening mammogram recommended in 1 year.
BI-RADS Category 1: Negative

## 2021-08-20 IMAGING — US US THYROID
1 series · 14 of 25 positions shown · non-contrast
Comparison: February 16, 2020

REASON FOR EXAM: Thyroid cancer

[Series 1: us thyroid · 25 acquisitions, 14 frames shown]
[im 1/25]
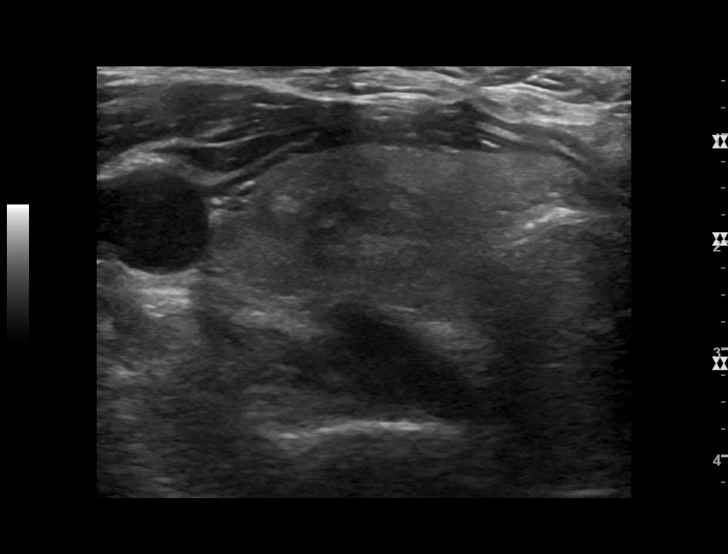
[im 3/25]
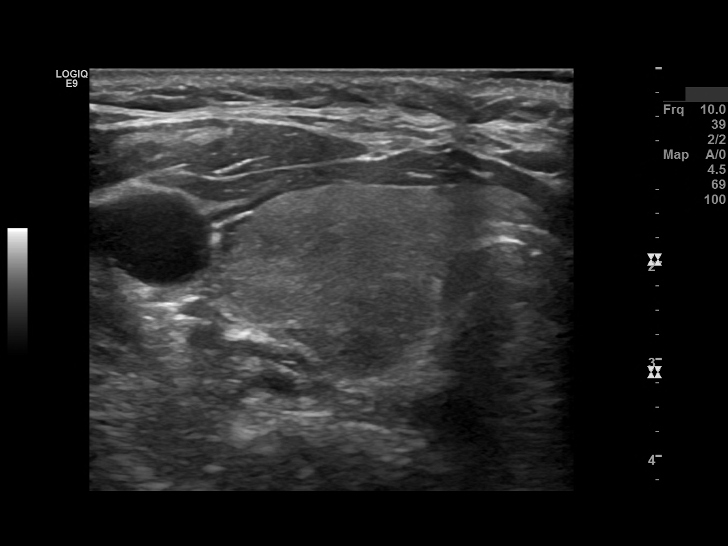
[im 5/25]
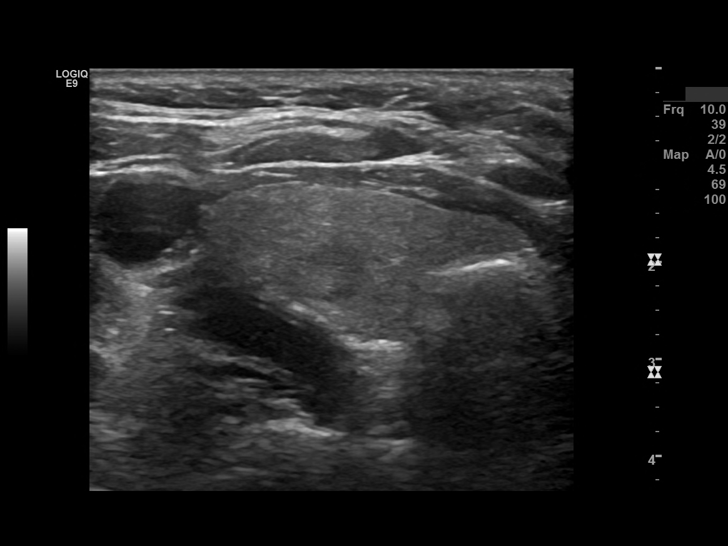
[im 7/25]
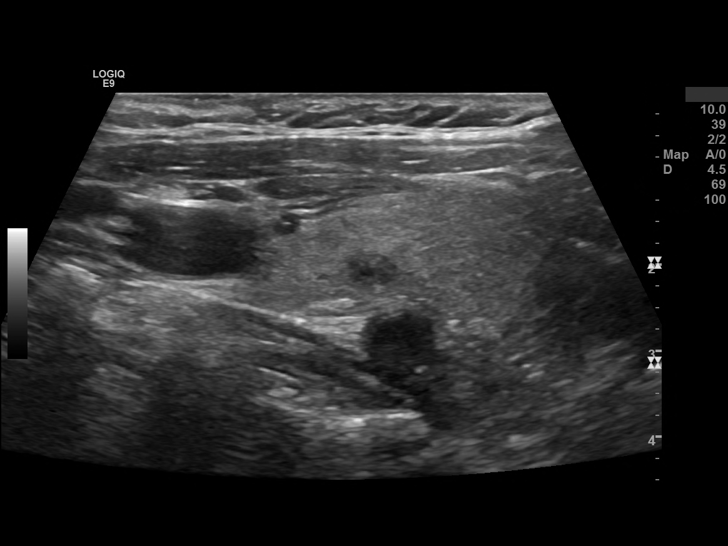
[im 9/25]
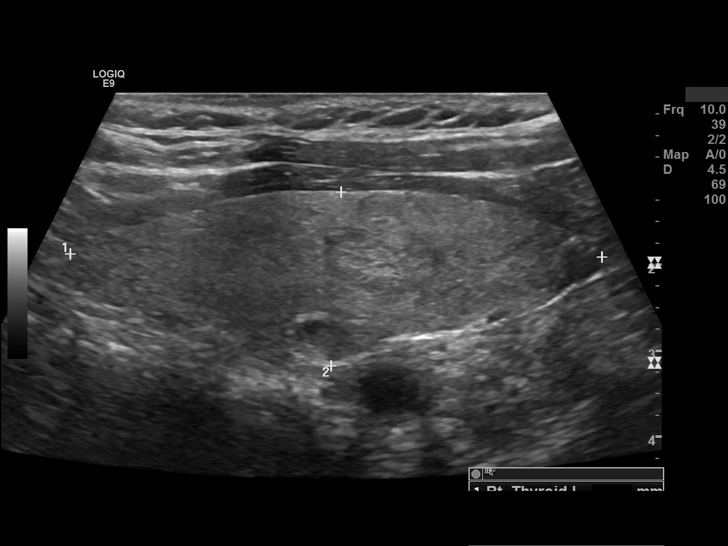
[im 10/25]
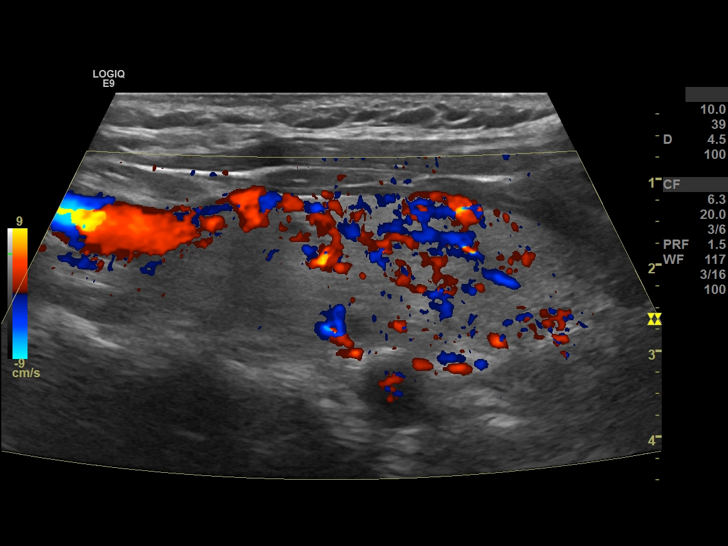
[im 12/25]
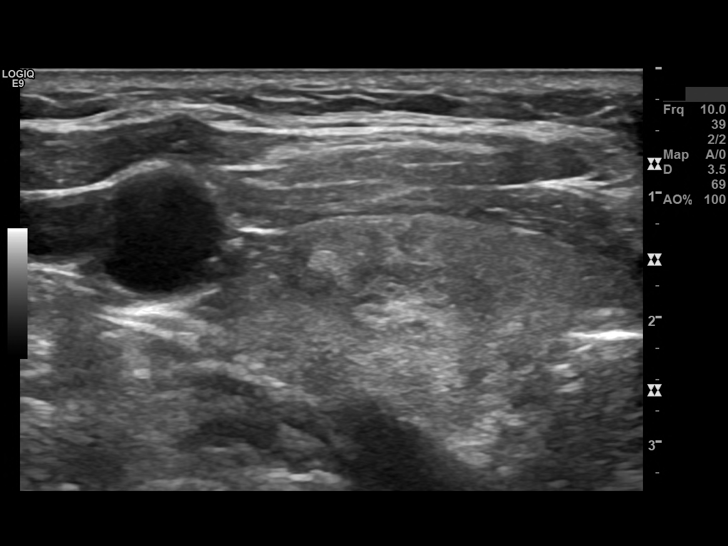
[im 14/25]
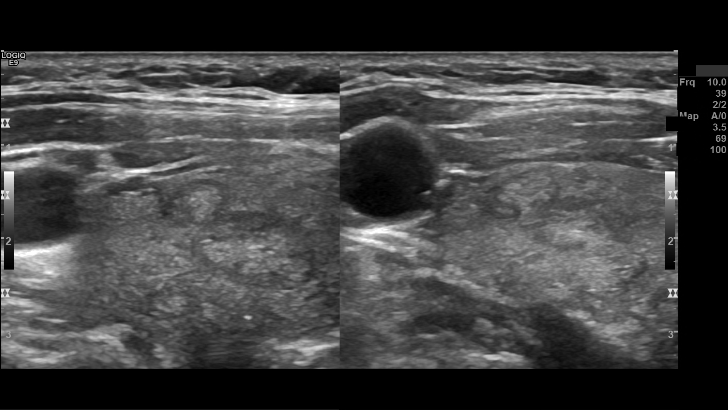
[im 16/25]
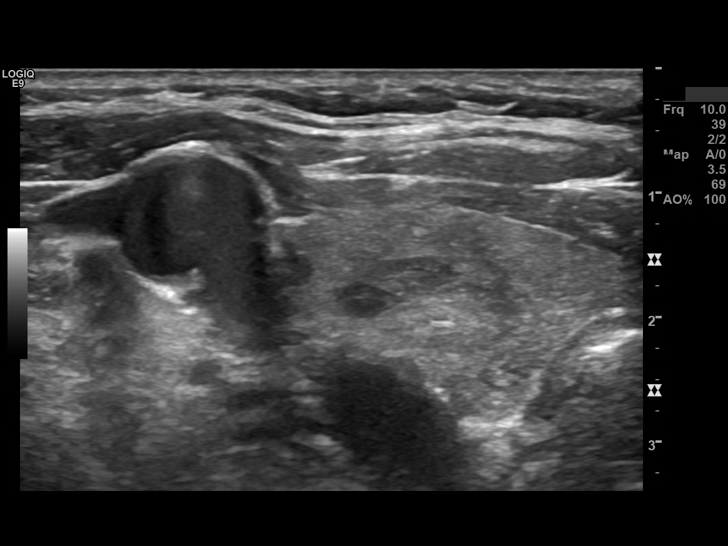
[im 17/25]
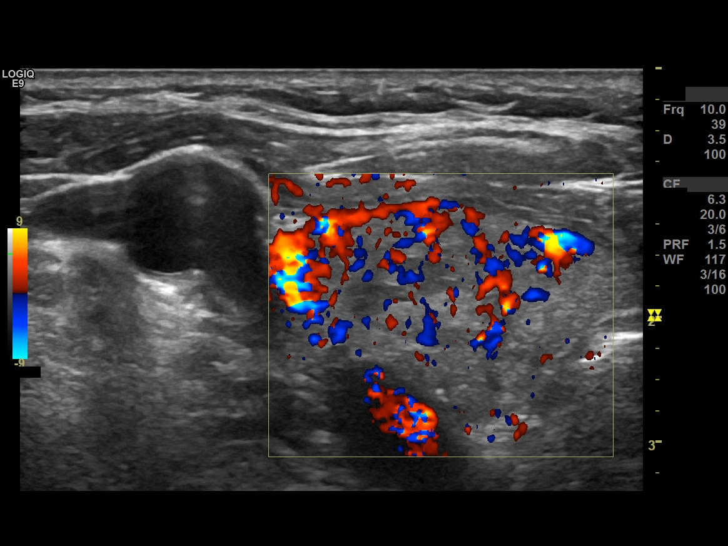
[im 19/25]
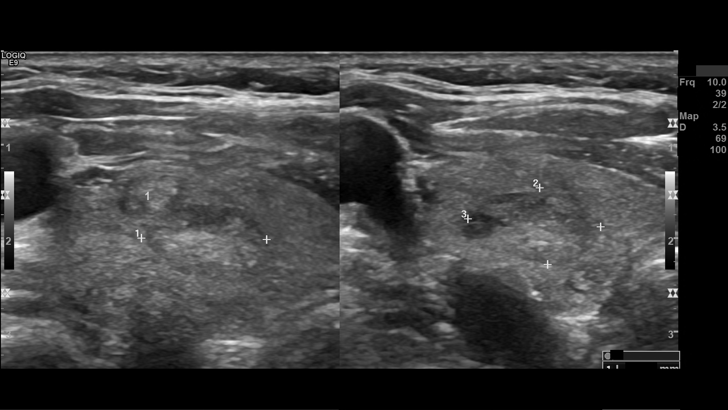
[im 21/25]
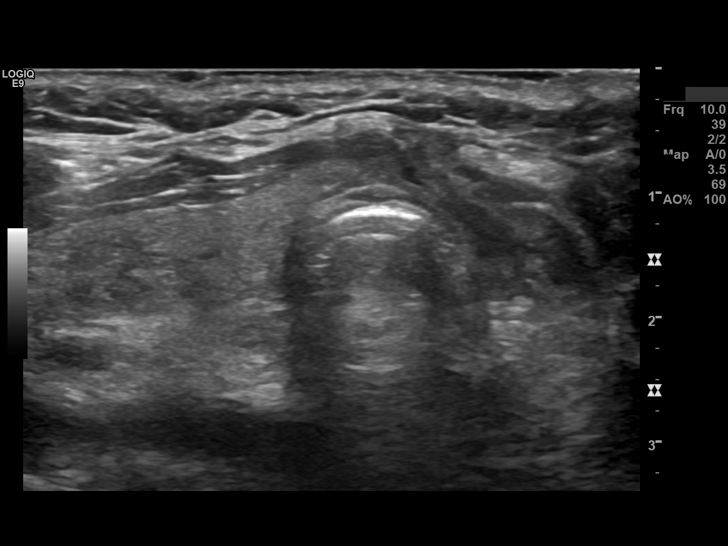
[im 23/25]
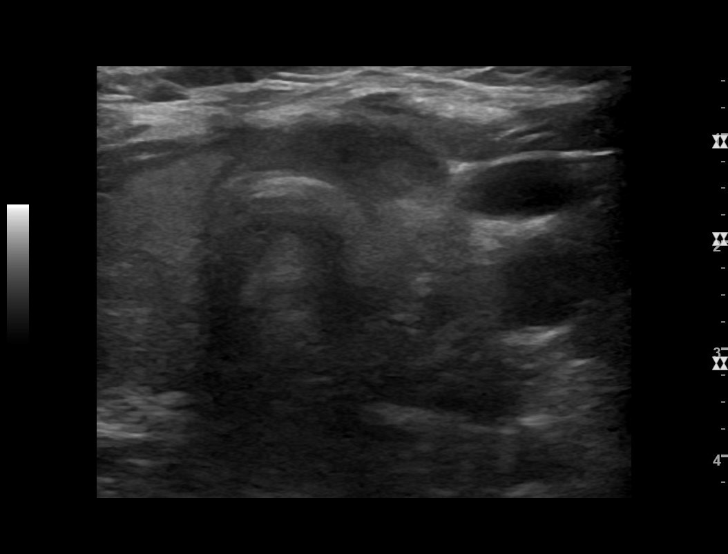
[im 25/25]
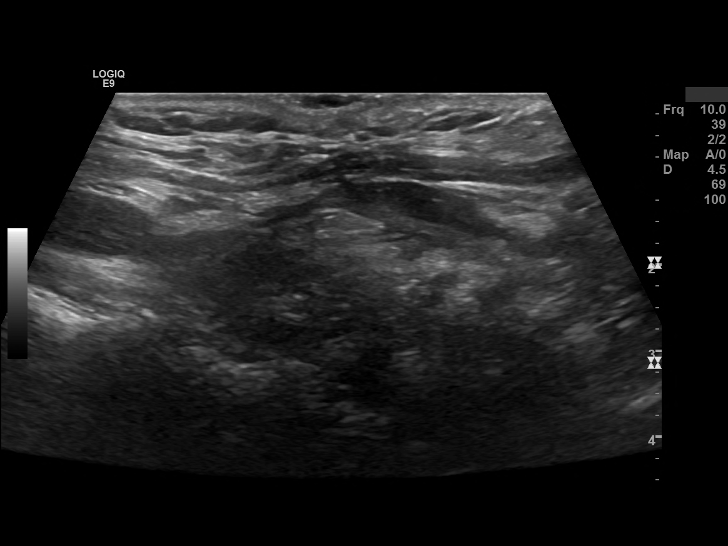

[14 of 25 positions shown; findings below may reference images not displayed]

TECHNIQUE AND FINDINGS:  Multiple ultrasound images of the thyroid gland were obtained. 

The right lobe of the thyroid measures 6.2 x 2.0 x 2.5 cm and has a mildly inhomogeneous echotexture. There are 2 small, isoechoic nodules seen in the mid lobe measuring 13 x 14 x 8 mm and 9 x 6 x 9 mm.

The thyroid isthmus measures 3 mm in thickness.

Patient is status post left thyroidectomy. There is no abnormal soft tissue seen in the surgical bed.
IMPRESSION: 1. Two small, stable appearing TR 3 right thyroid nodules. 3 year follow-up is recommended.

2. Status post left thyroidectomy.

ACR TI-RADS recommendations:

TR5 (>= 7 points) - FNA if > 1 cm, follow up if 0.5-0.9 cm every year for 5 years

TR4 (4-6 points) - FNA if >=1.5 cm, follow up if 1-1.4 cm in 1, 2, 3, and 5 years

TR3 (3 points) - FNA if 2.5 cm, follow up if 1.5-2.4 cm in 1, 3, and 5 years

TR2  (2 points) & TR1 (0 points) - No FNA or follow up.

Igar, Lajos Patrik, et al.  ACR Thyroid Imaging, Reporting and Data System (TI-RADS):  White Paper of the ACR TI-RADS Committee. J Am Coll Radiology 1054, 14(5) 587-595.

## 2021-11-06 IMAGING — CT CT NECK SOFT TISSUE W CONTRAST
3 of 4 series · 15 of 33 positions shown, 18 images · IV contrast (agent unspecified)
Comparison: CT neck 09/06/20

REASON FOR EXAM: 75-year-old female with malignant neoplasm of thyroid gland.
TECHNIQUE: Multiple helical CT images of the neck were acquired after the administration of IV contrast.  Multiplanar reformatted images were also generated. Dose reduction technique used: Automated exposure control and adjustment of the mA and/or kV according to patient size. CT count in previous 12-months: 0   

IV Contrast: 100 mL 4sovue-5TT
i-STAT Creatinine: 0.6 mg/dL

[Series 3: soft tissue · axial · 0.45mm/px · z∈[-1024,-819]mm · 7 of 127 slices shown, 9 images]
[im 12/127  soft-tissue]
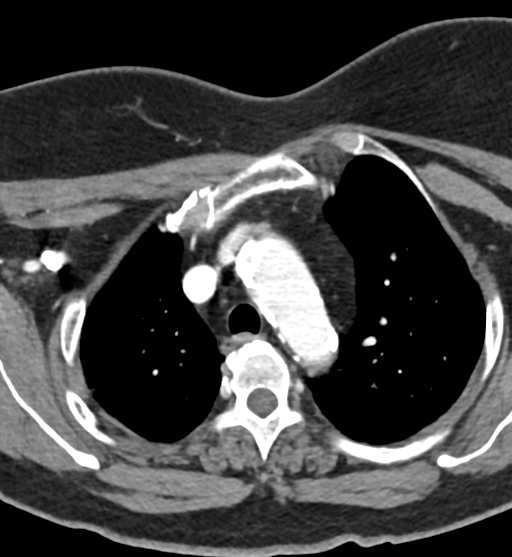
[im 12/127  bone]
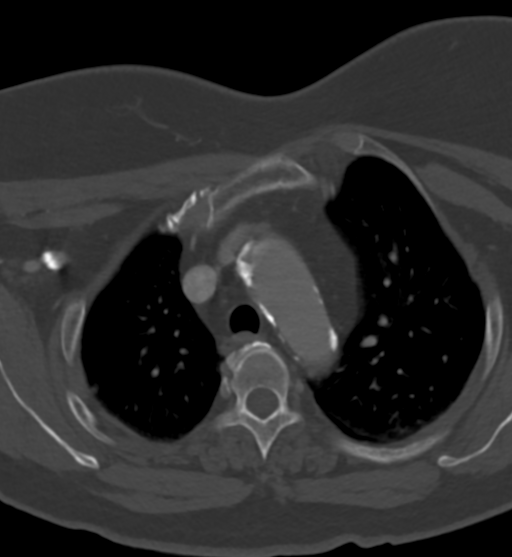
[im 35/127  bone]
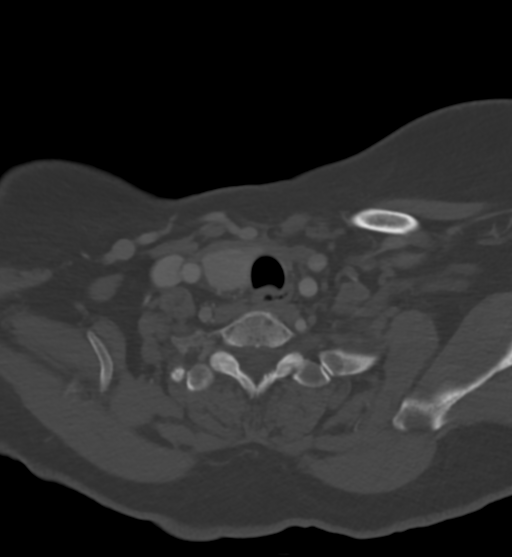
[im 46/127  bone]
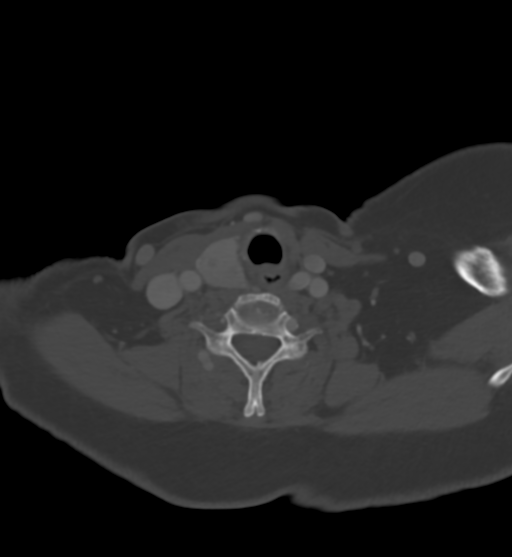
[im 69/127  bone]
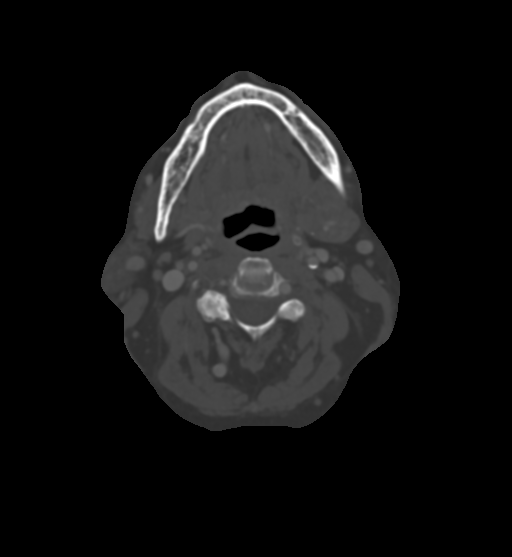
[im 81/127  soft-tissue]
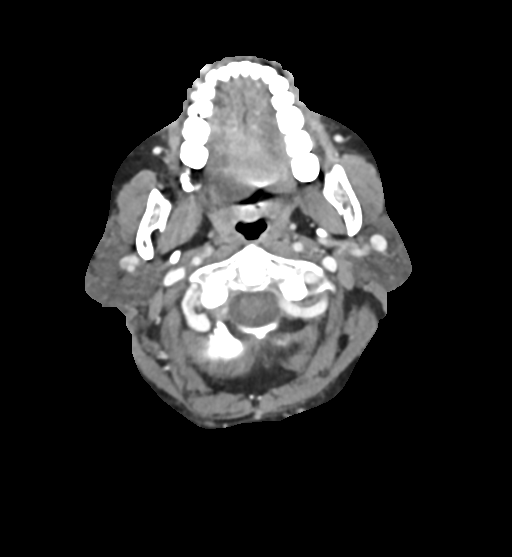
[im 81/127  bone]
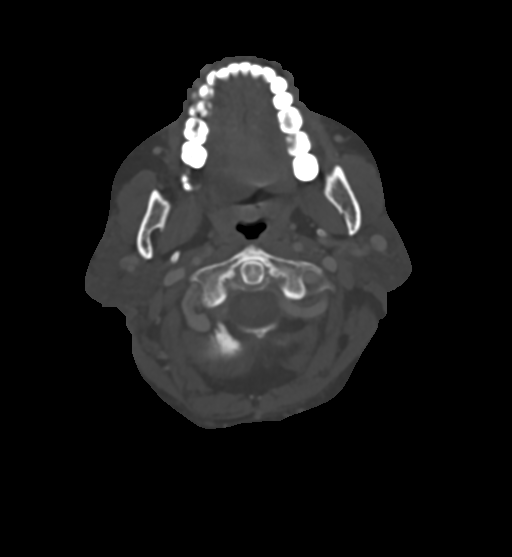
[im 92/127  bone]
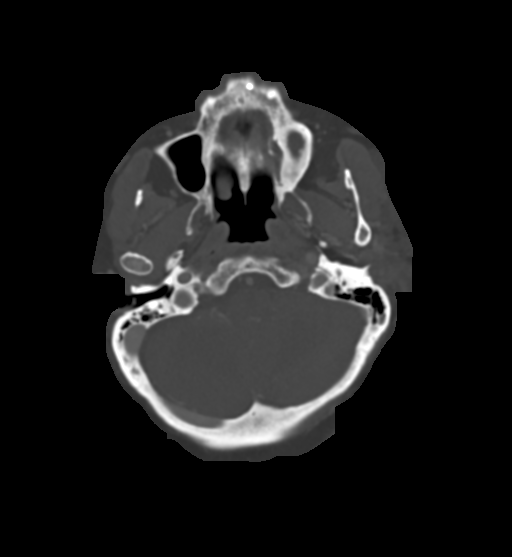
[im 115/127  bone]
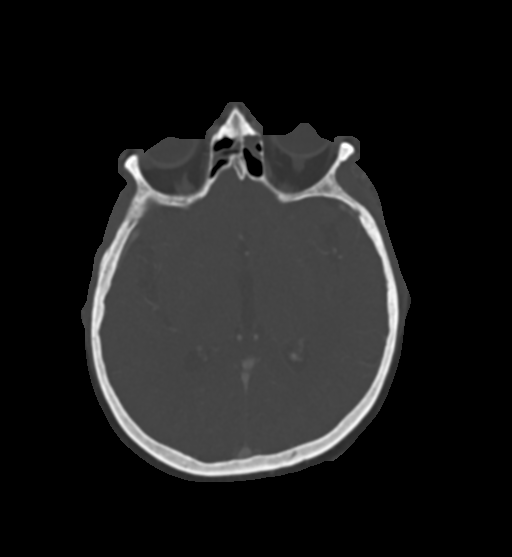

[Series 7: coronal · coronal · 0.45mm/px · 3 of 98 slices shown]
[im 20/98  bone]
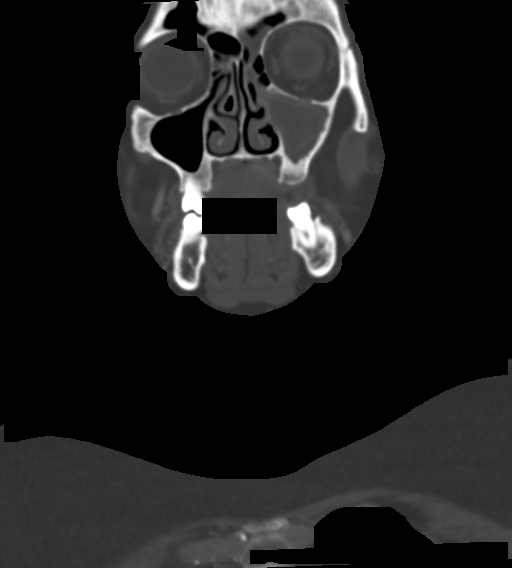
[im 39/98  bone]
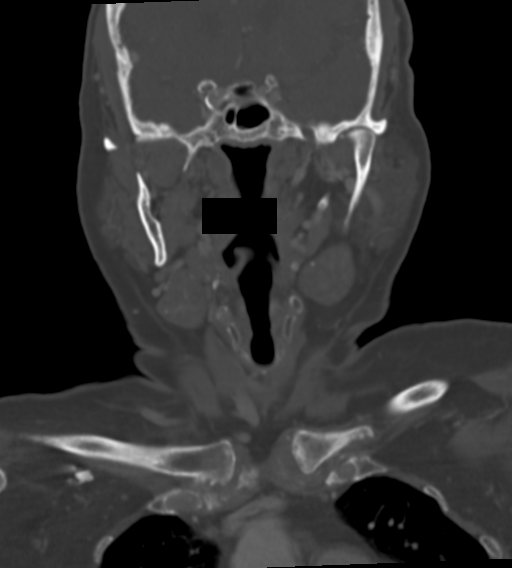
[im 59/98  bone]
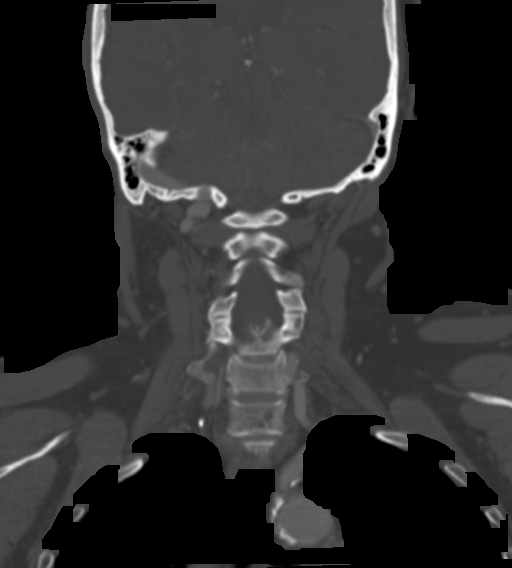

[Series 9: sagittal · sagittal · 0.49mm/px · 5 of 114 slices shown, 6 images]
[im 38/114  bone]
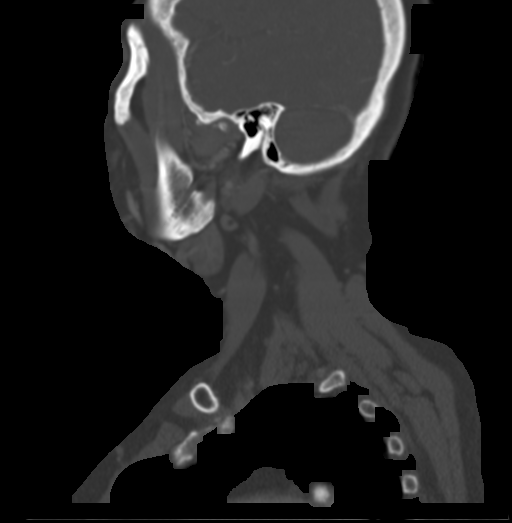
[im 48/114  bone]
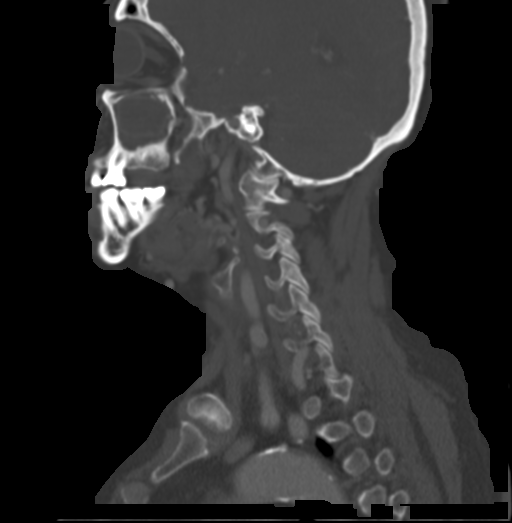
[im 57/114  soft-tissue]
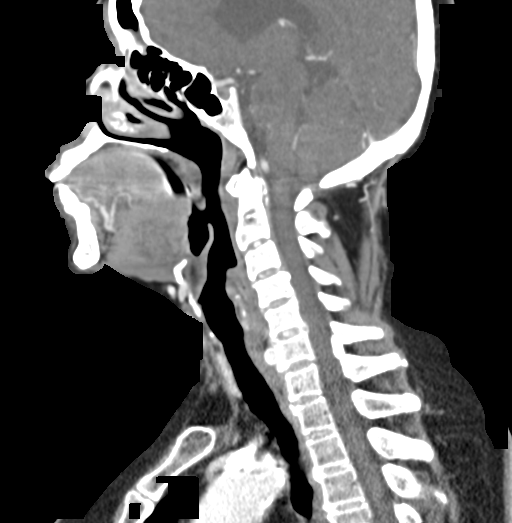
[im 57/114  bone]
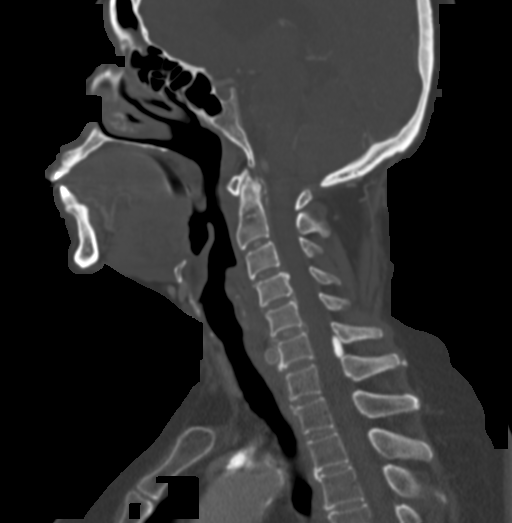
[im 66/114  bone]
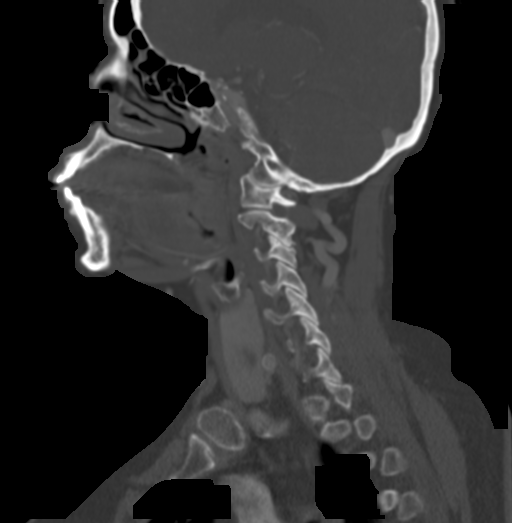
[im 76/114  bone]
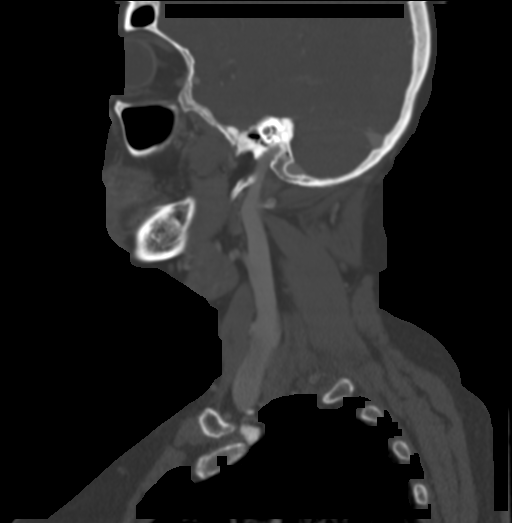

[15 of 33 positions shown; findings below may reference images not displayed]

FINDINGS: Scout: Unremarkable.

Brain/orbits: No significant abnormality. 

Paranasal sinuses/mastoid air cells: Completely opacified left maxillary sinus. Mild mucosal thickening in the left frontal sinus and left greater than right ethmoid air cells.

Mucosal space: No mass.

Masticator space: No asymmetric muscle atrophy or enlargement.

Parapharyngeal/retropharyngeal space: Clear.

Salivary glands: Calcifications in the bilateral submandibular glands, more extensive on the right. No ductal dilation. No mass or inflammation.

Thyroid: Interval left thyroidectomy. Redemonstrated multiple nodules in the right thyroid lobe, the largest measuring up to 14 mm.

Lymph nodes: No lymphadenopathy utilizing size or enhancement criteria.

Upper thorax: Stable 3 mm subpleural right upper lobe nodule ([DATE]).

Osseous structures: No suspicious osseous lesion or acute fracture. Unchanged dense calcification in the posterior spinal canal at C6-C7 measuring 4 x 10 mm.

Soft tissues: Patent left to right carotid bypass graft. Atherosclerosis of the bilateral carotid arteries with suspected high-grade stenosis in the proximal right internal carotid artery.
IMPRESSION: 1.
Interval left thyroidectomy. Redemonstrated multiple nodules in the right thyroid lobe, the largest measuring up to 14 mm.

2.
Otherwise, no mass or lymphadenopathy in the neck.

3.
Patent left to right carotid bypass graft. 

4.
Suspected high-grade stenosis in the proximal right internal carotid artery.

Total radiation dose to patient is CTDIvol 13.80 mGy and DLP 392.00 mGy-cm.

## 2022-03-25 IMAGING — US DOP CAROTID BILATERAL
1 series · 13 of 24 positions shown · non-contrast
Comparison: Correlation is made to previous CT of the neck with contrast November 06, 2021 that show heavily calcified origin of the right brachiocephalic artery with patent left common carotid to right common carotid artery bypass graft.

HISTORY: 75-year-old female with other specified symptoms and signs involving the circulatory and respiratory systems.
TECHNIQUE: Gray-scale, color Doppler and spectral Doppler imaging of the bilateral carotid and vertebral arteries is performed.

[Series 1: dop carotid bilateral · 13 of 81 slices shown]
[im 1/81]
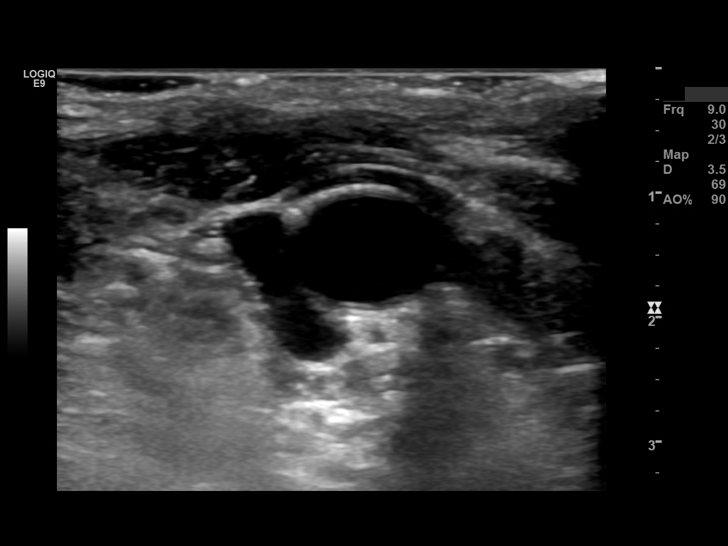
[im 7/81]
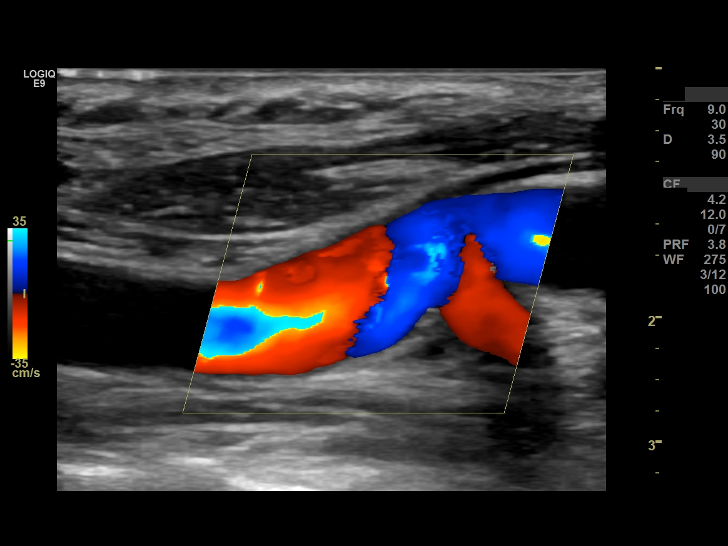
[im 14/81]
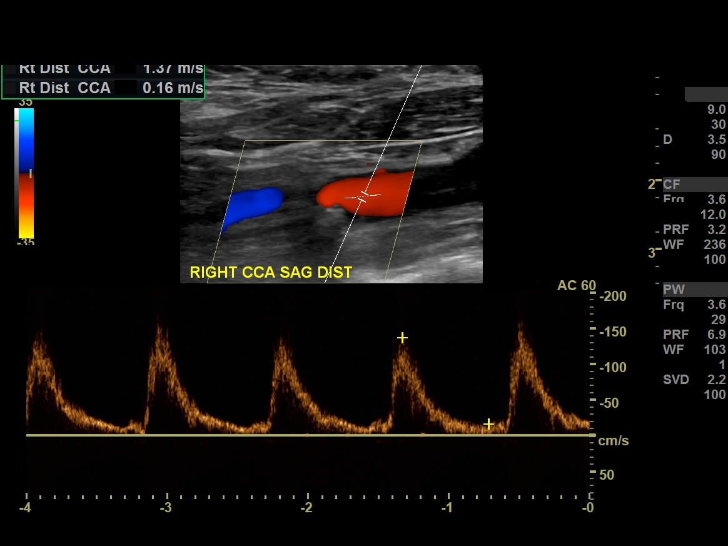
[im 21/81]
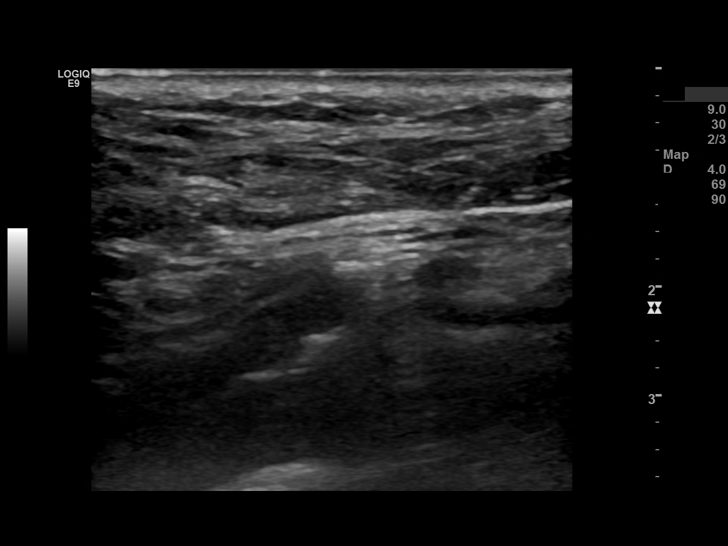
[im 28/81]
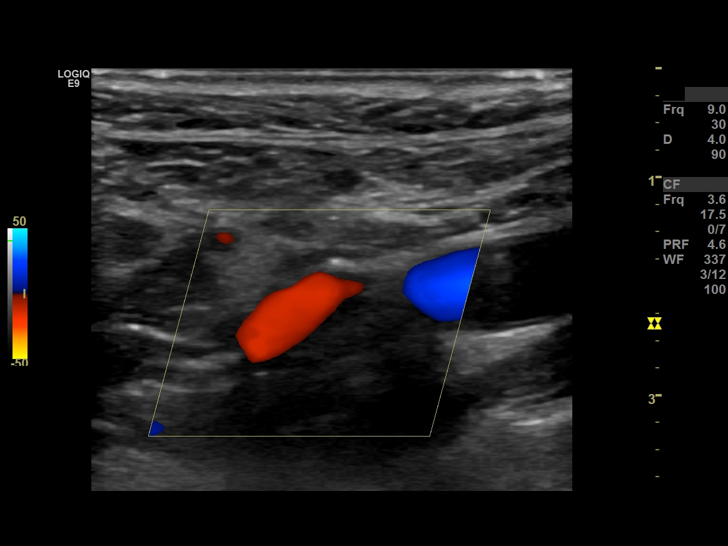
[im 35/81]
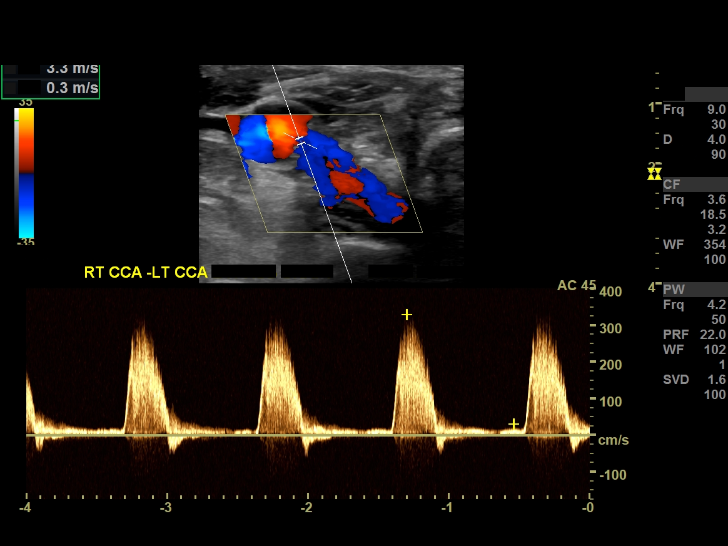
[im 42/81]
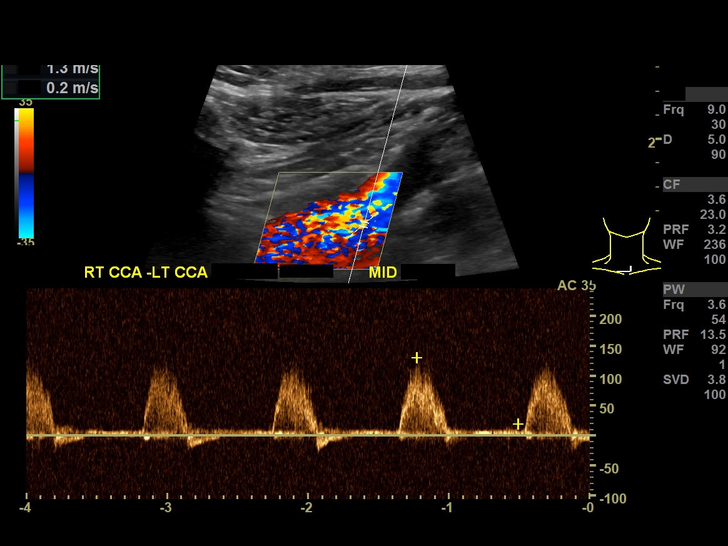
[im 46/81]
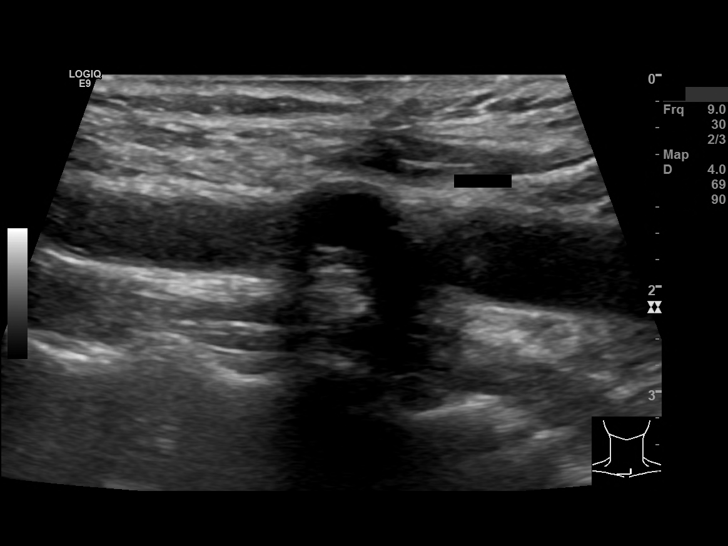
[im 53/81]
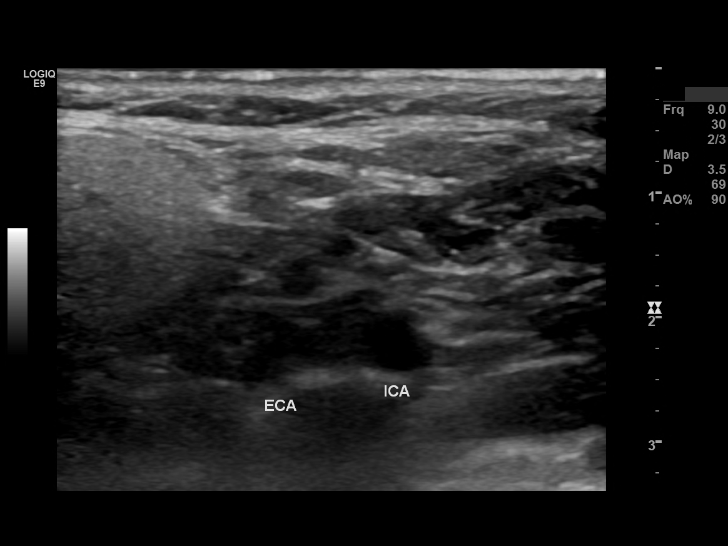
[im 60/81]
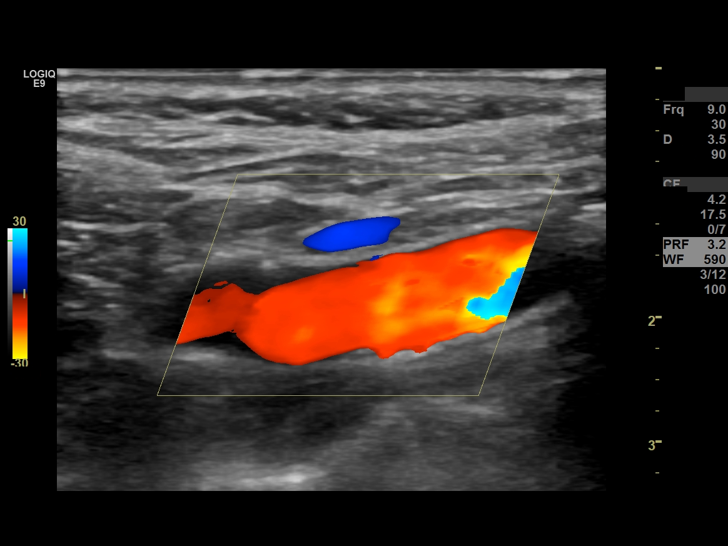
[im 67/81]
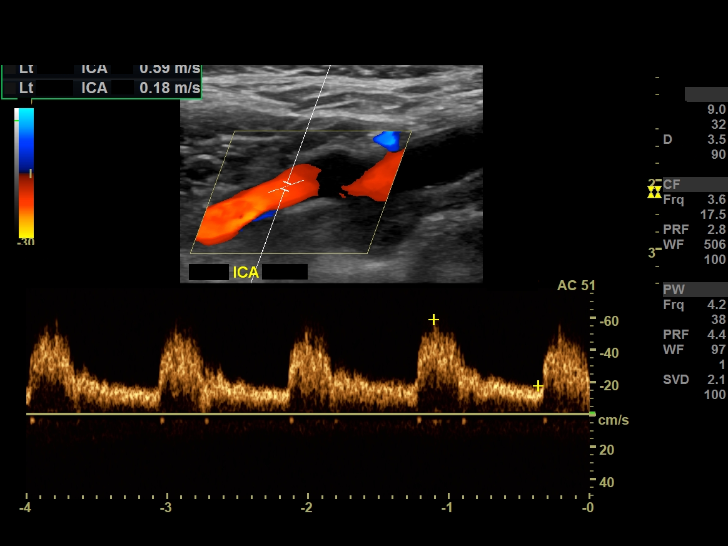
[im 74/81]
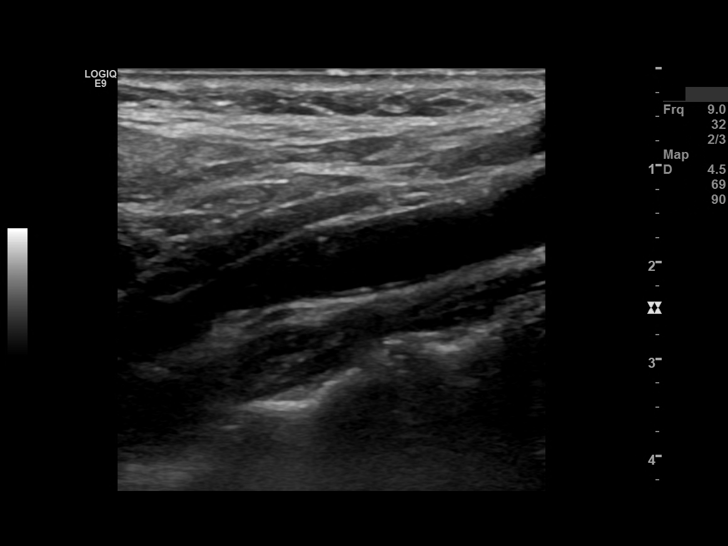
[im 81/81]
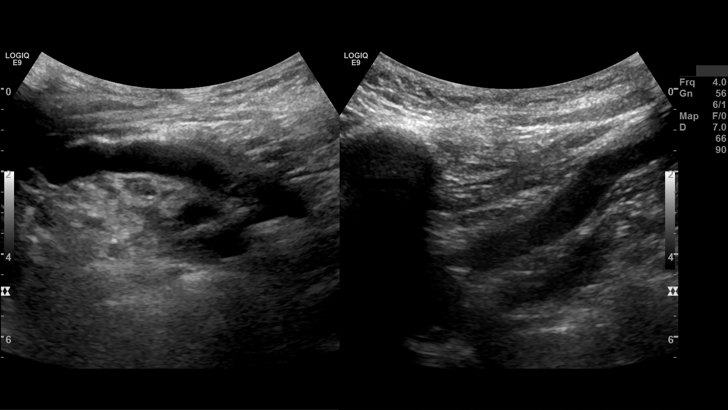

[13 of 24 positions shown; findings below may reference images not displayed]

FINDINGS: Gray-scale soft/calcified carotid plaques: Mild atherosclerotic plaquing is seen through the carotid arteries.

CARDIAC RHYTHM: REGULAR

RIGHT CAROTID ARTERY:

ICA peak systolic velocity: 1.44  m/s

CCA peak systolic velocity: 1.47 m/s

ICA/CCA Ratio:

ICA end-diastolic velocity: 0.23 m/s

ECA velocity:  0.64 m/s

Vertebral artery peak systolic velocity: 0.23 m/s

Vertebral artery direction: ANTEGRADE

LEFT CAROTID ARTERY:

ICA peak systolic velocity: 0.82 m/s

CCA peak systolic velocity: 1.72 m/s

ICA/CCA Ratio:

ICA end-diastolic velocity: 0.23 m/s

ECA velocity:  0.71 m/s

Vertebral artery peak systolic velocity: 0.59 m/s

Vertebral artery direction: ANTEGRADE

There is patent left common carotid to right common carotid artery bypass graft.
IMPRESSION: 1.  CARDIAC RHYTHM: REGULAR

2.  Gray-scale soft/calcified carotid plaques:  There are no significant plaques identified in both carotid arteries. 

3.  No hemodynamically significant stenosis of the carotid arteries.

4. Patent left common carotid to right common carotid artery bypass graft.

Stenosis velocity criteria are extrapolated from diameter data as defined by the Society of Radiologists in Ultrasound Consensus Conference Radiology 0667; 229; [phone]

## 2022-08-11 IMAGING — MG MAMMO SCRN BIL W/CAD TOMO
8 series · 8 of 24 positions shown · non-contrast
Comparison: The present examination has been compared to prior imaging studies.

Images Obtained from [facility]
INDICATION: Screening.
TECHNIQUE: Bilateral 2-D digital screening mammogram was performed followed by 3-D tomosynthesis.  Current study was also evaluated with a computer aided detection (CAD) system.
MAMMOGRAM FINDINGS:
There are scattered areas of fibroglandular density.
No suspicious abnormality is seen in either breast.  There are no significant changes from the prior study.

[L CC]
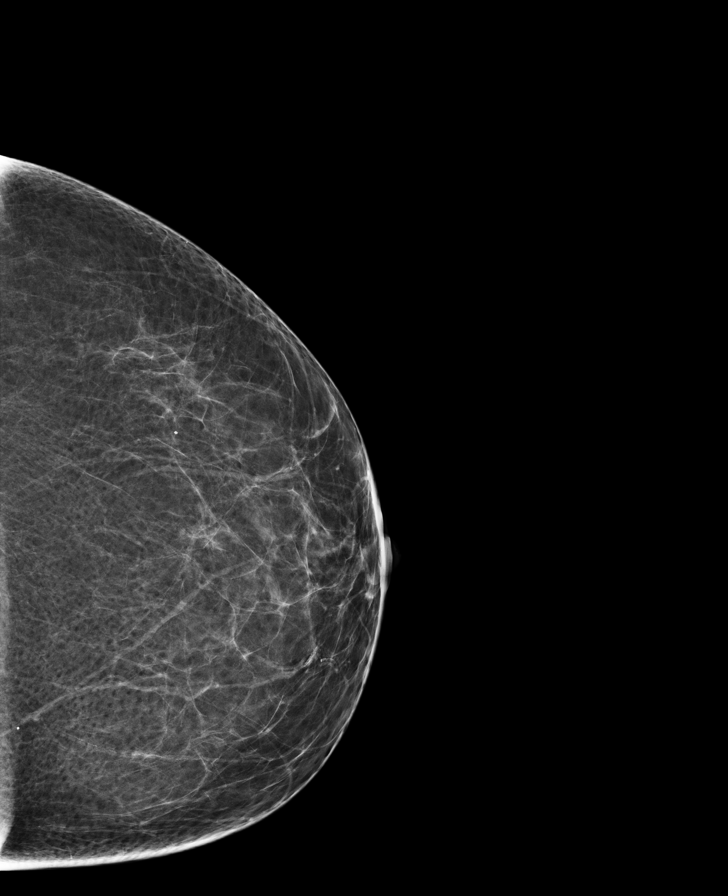

[R CC]
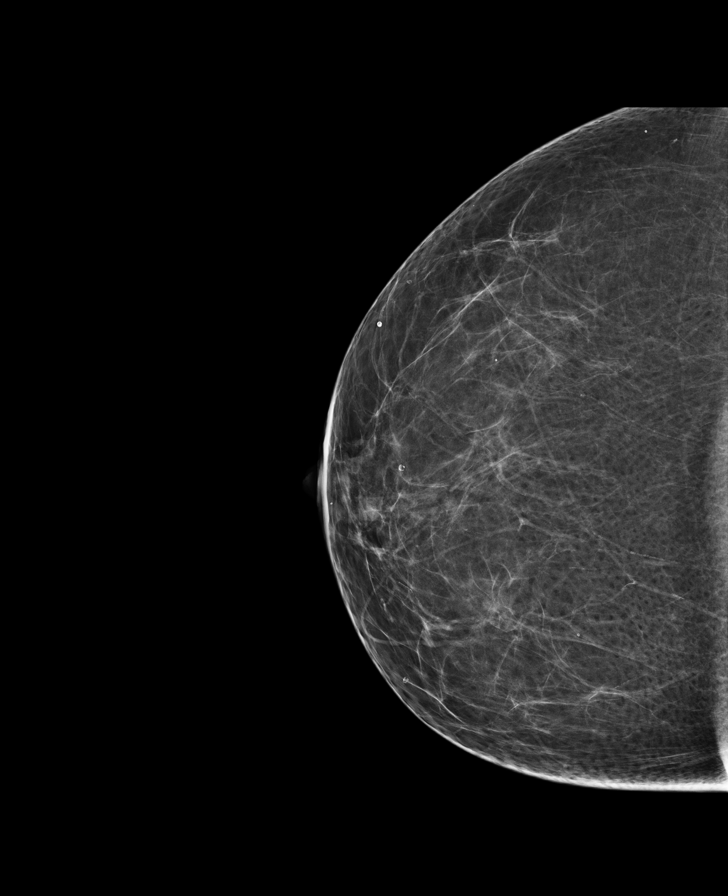

[L MLO]
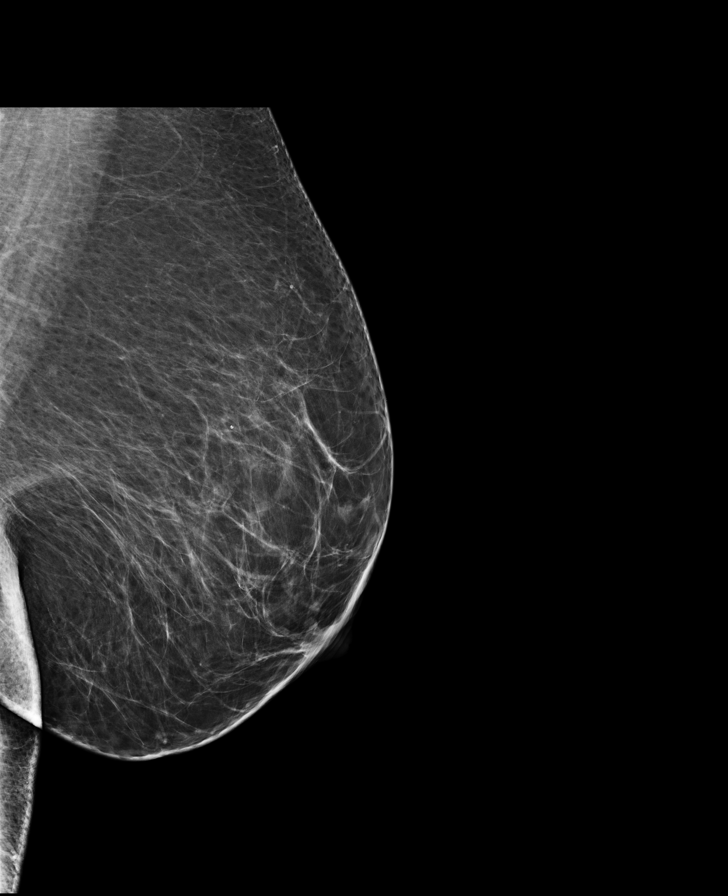

[R MLO]
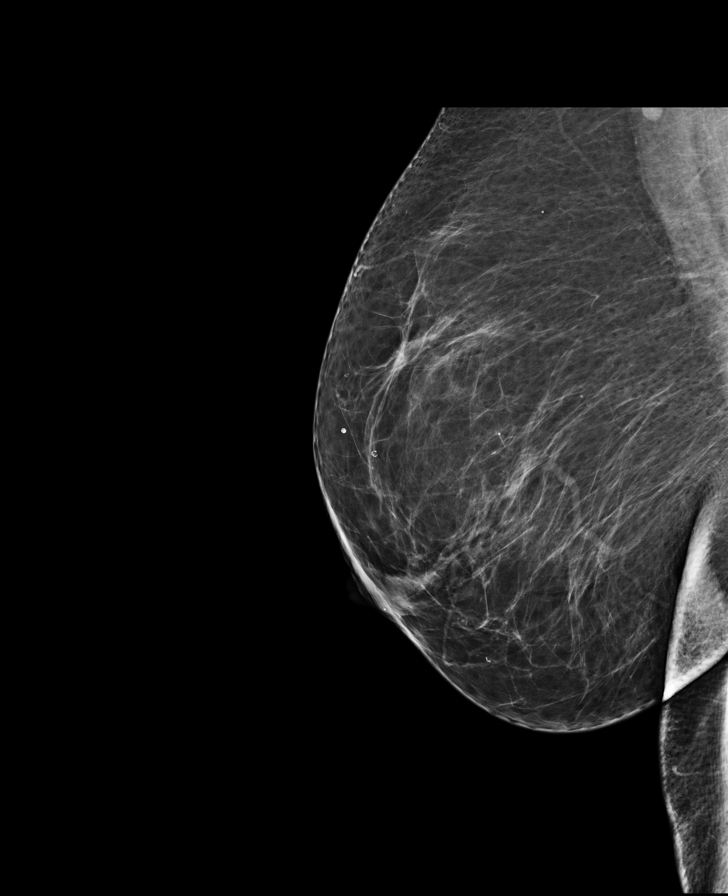

[R MLO tomo · tomo slice 39/78.0]
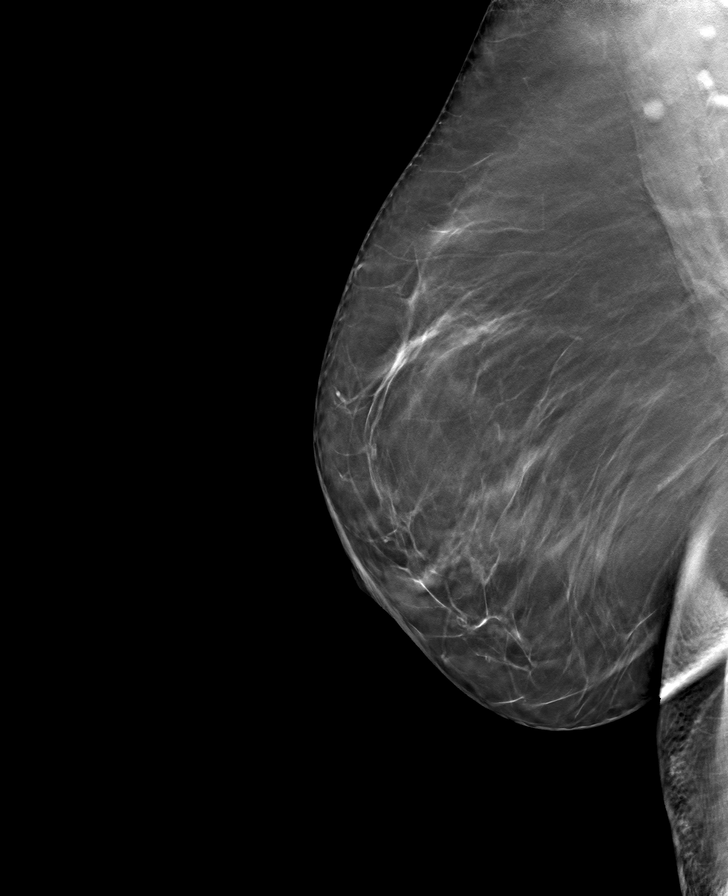

[R CC tomo · tomo slice 34/67.0]
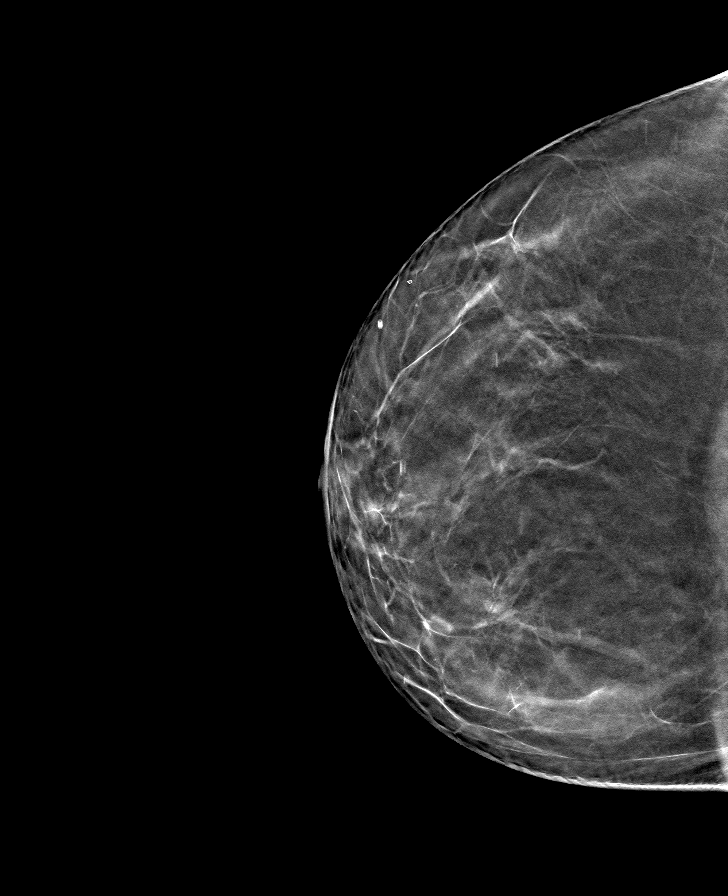

[L CC tomo · tomo slice 33/65.0]
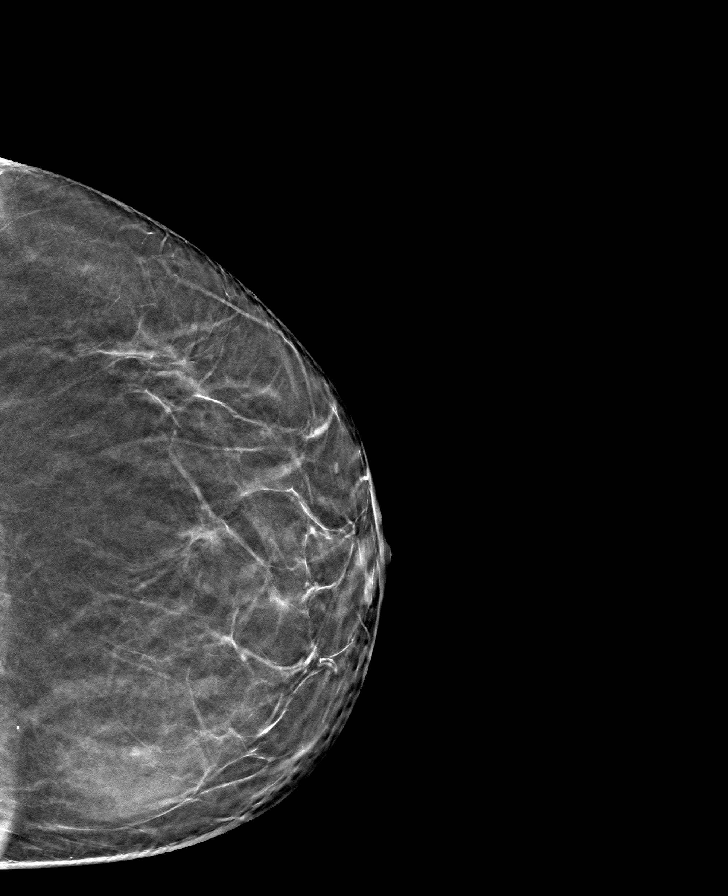

[L MLO tomo · tomo slice 39/78.0]
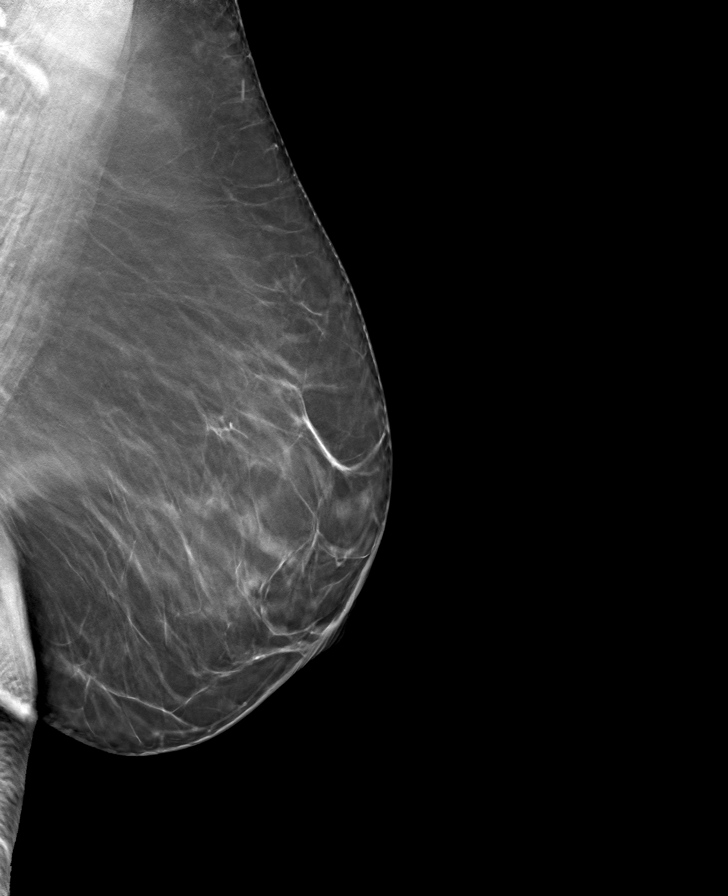

[8 of 24 positions shown; findings below may reference images not displayed]

IMPRESSION: There is no mammographic evidence of malignancy.
Screening mammogram recommended in 1 year.
BI-RADS Category 1: Negative

## 2023-07-02 IMAGING — MR MRI BRAIN W/WO CONTRAST
6 of 13 series · 12 of 48 positions shown · IV contrast (prohance)
Comparison: None.

Images Obtained from [facility]
HISTORY: 77 years-old Female with Dizziness and giddiness.
TECHNIQUE: Pre- and postcontrast enhanced  MRI study of the brain was performed using sagittal, coronal and axial images of varying sequences.
IV contrast:  15 cc ProHance.

[Series 1: bSSFP · axial · 8.0mm · 0.68mm/px · 1 of 19 slices shown]
[im 1/19]
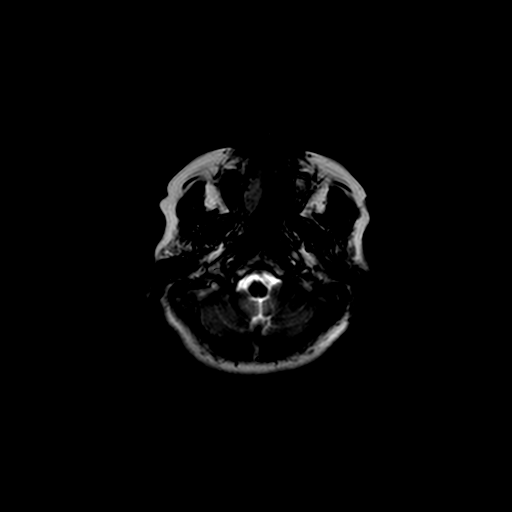

[Series 2: t1_mprage_axial_smbr_recon · axial · 1.0mm · 0.40mm/px · z∈[-77,+80]mm · 6 of 160 slices shown]
[im 1/160]
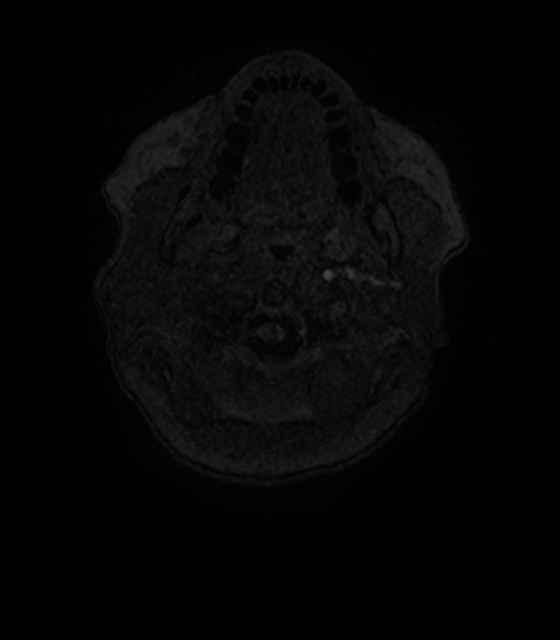
[im 32/160]
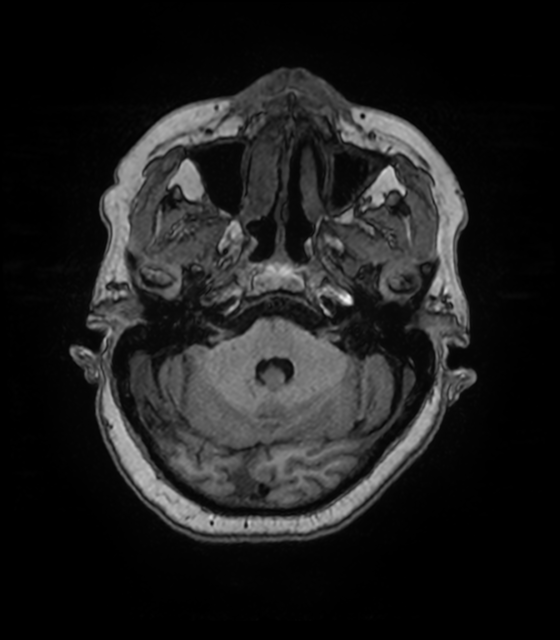
[im 64/160]
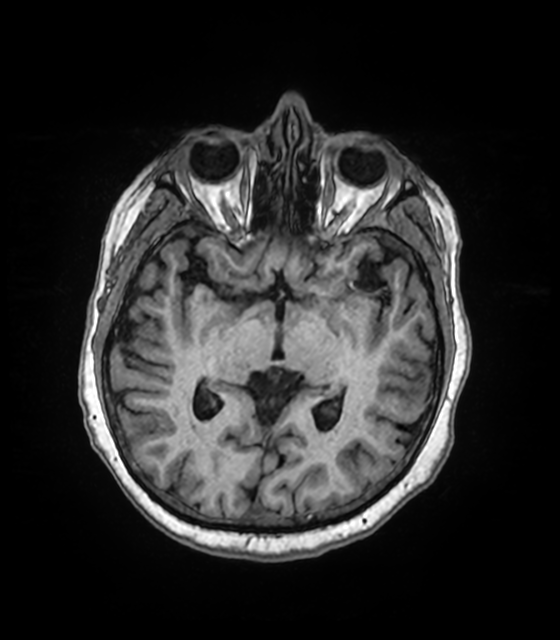
[im 96/160]
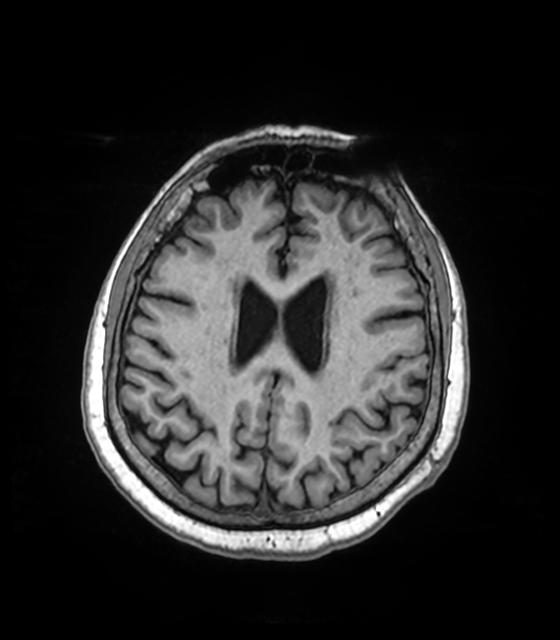
[im 128/160]
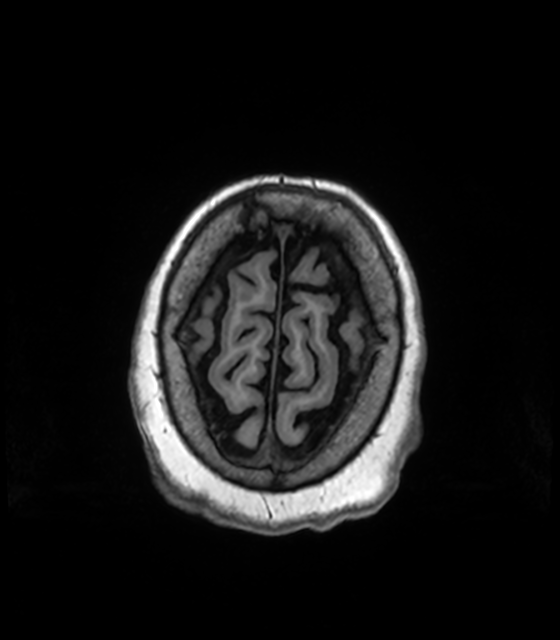
[im 160/160]
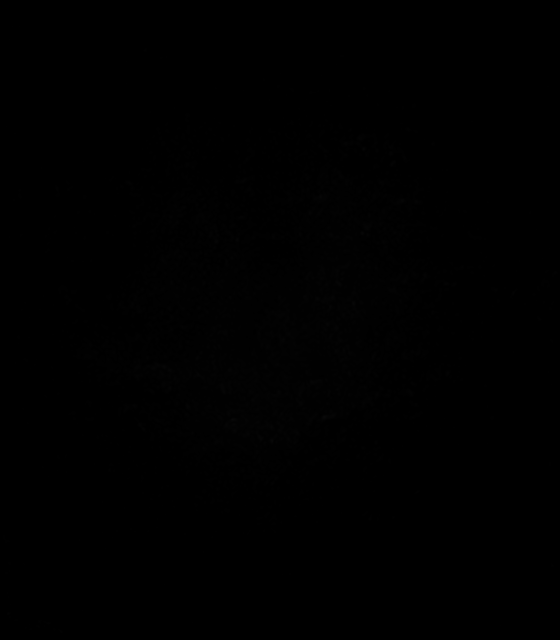

[Series 3: t2_axial_smbr_recon · axial · 5.0mm · 0.36mm/px · 1 of 25 slices shown]
[im 1/25]
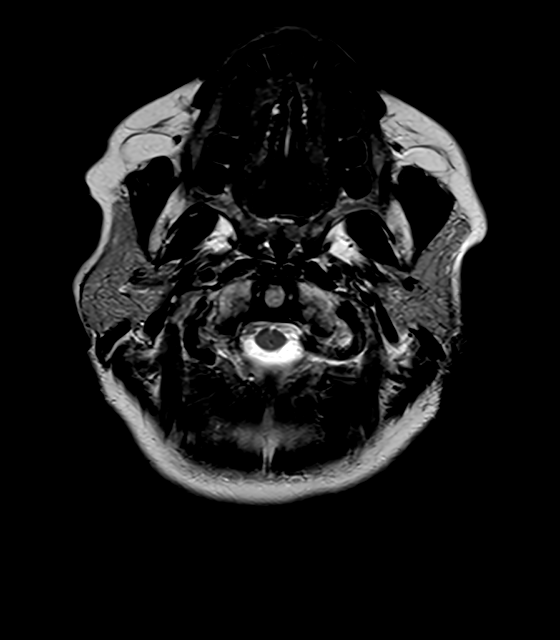

[Series 4: t1_mprage_cor_rft_smbr_recon · coronal · 1.5mm · 0.25mm/px · 2 of 185 slices shown]
[im 1/185]
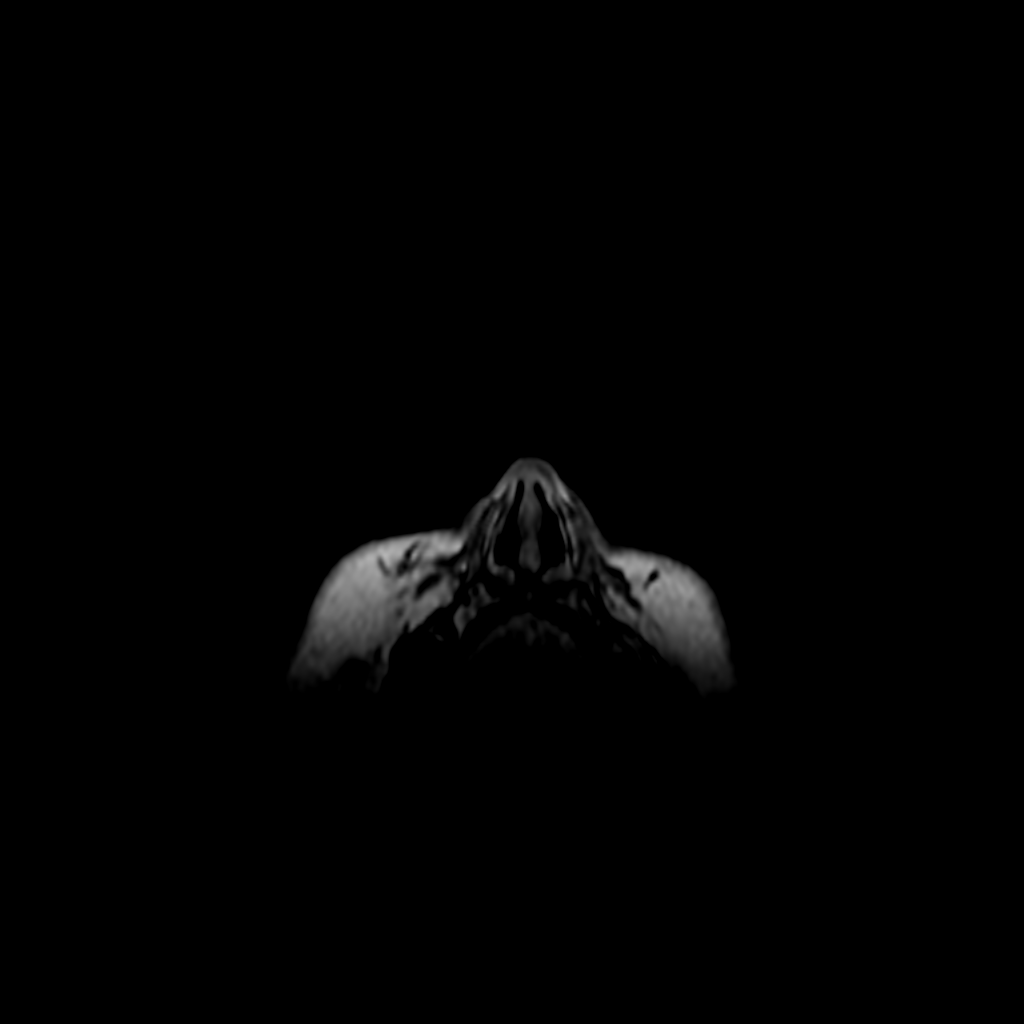
[im 31/185]
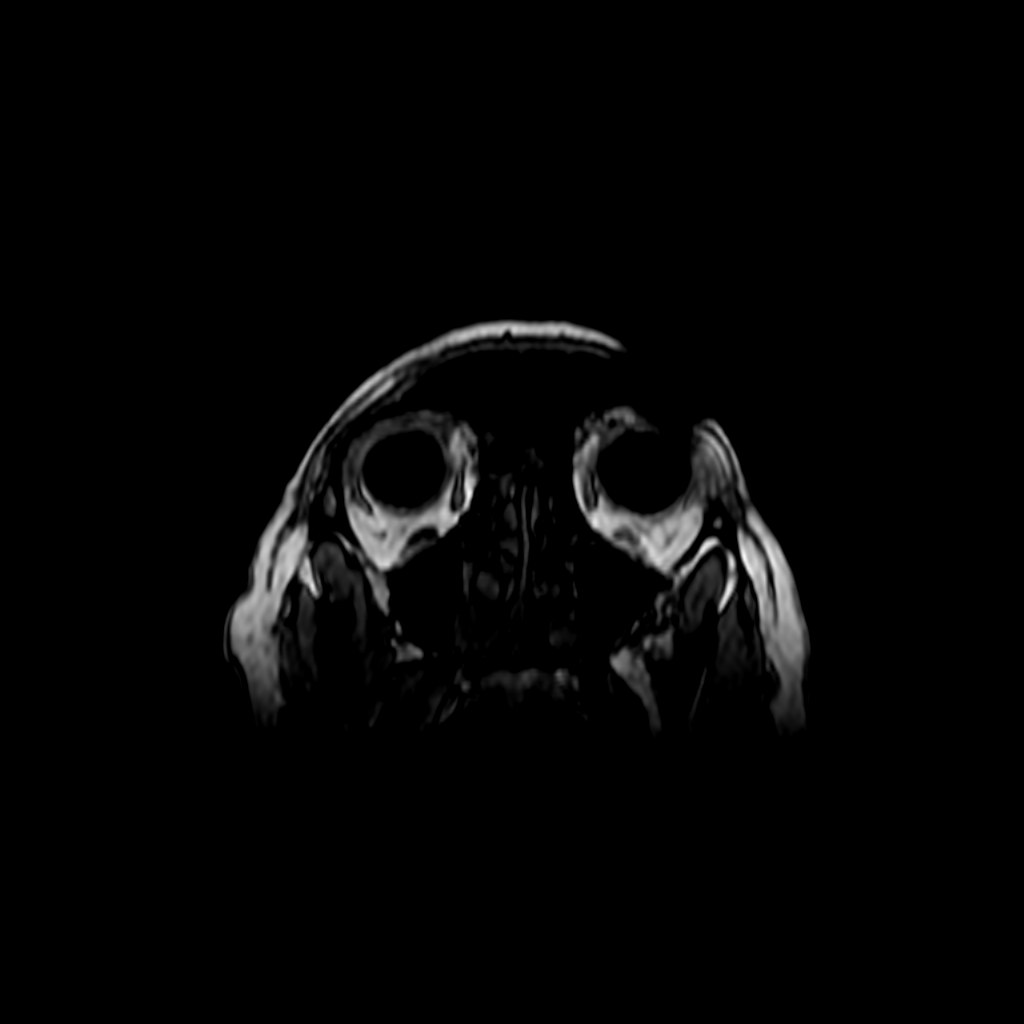

[Series 7: DWI · axial · 5.0mm · 0.38mm/px · 1 of 25 slices shown (1 of 2)]
[im 1/25]
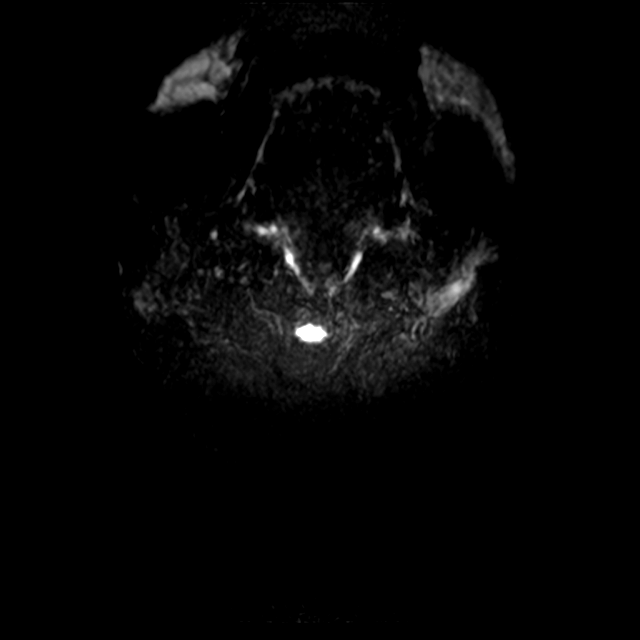

[Series 8: DWI · axial · 5.0mm · 0.63mm/px · 1 of 25 slices shown (2 of 2)]
[im 1/25]
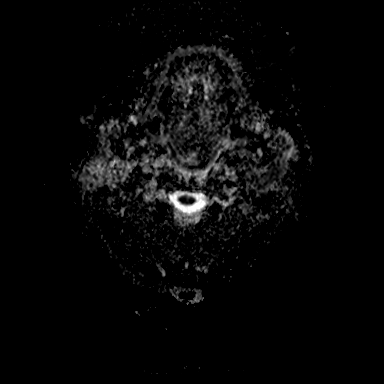

[12 of 48 positions shown; findings below may reference images not displayed]

FINDINGS: Ventricles and sulci: The ventricles and sulci are within normal limits.
Gray/white matter: Supratentorial white matter T2/FLAIR hyperdensity are nonspecific but statistically most likely to be related to chronic microangiopathy changes.
Brain stem, cerebellum and midline structures: The brain stem, cerebellum and the midline structures are within normal limits.
Flow voids: Flow voids are demonstrated in the major intracranial vessels.
Sinuses and mastoid air cells: The sinuses and the mastoid air cells are within normal limits.
Globes: The globes are unremarkable.
There is no MR evidence of acute infarct, hemorrhage or enhancing intracranial lesion.
IMPRESSION: 1.  No MRI evidence of acute infarct, hemorrhage or enhancing intracranial lesion.
2.  Supratentorial white matter T2/FLAIR hyperdensity are nonspecific but statistically most likely to be related to chronic microangiopathy changes.

## 2023-10-20 IMAGING — CT CTA NECK WO-W CONTRAST
1 of 8 series · 8 of 33 positions shown · IV contrast (agent unspecified)
Comparison: 12/06/19

Images Obtained from [facility]
CTA NECK:
HISTORY: 77 years-old Female with Occlusion and stenosis of bilateral carotid arteries.
TECHNIQUE: CT angiography was obtained from the aortic arch to the level of the base of the skull first without contrast followed by the intravenous administration of nonionic contrast material.
Coronal, sagittal, and bilateral oblique reconstructions were obtained. Multiplanar MIP reconstructions were created. 2-D and 3-D reconstructed images also obtained. Post-processing software
generated rotating 3-D and MIP images obtained.
Dose reduction technique used: Automated exposure control and adjustment of the mA and/or kV according to patient size. CT Studies and Cardiac Nuclear Medicine Studies in last 12-months = 0
Total radiation dose to patient is CTDIvol 26.09 mGy and DLP 433.38 mGy-cm.
IV contrast: 60 mL [id number]. Serum creatinine draw is 0.6 mg/dL by i-STAT.

[Series 7: angio · axial · 0.44mm/px · z∈[-1018,-794]mm · 8 of 290 slices shown]
[im 33/290  soft-tissue]
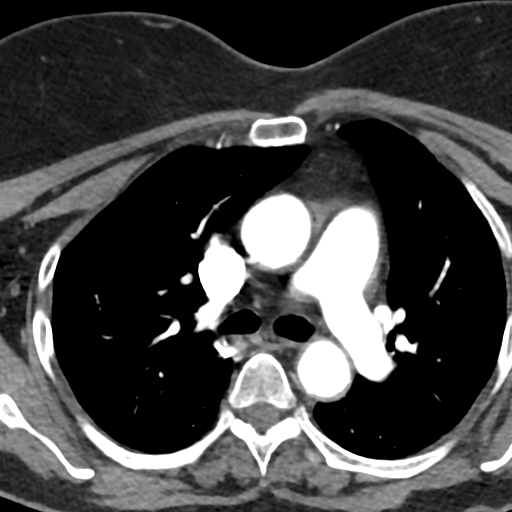
[im 65/290  bone]
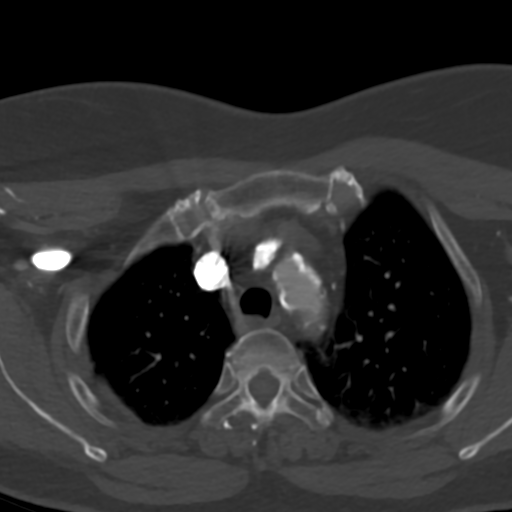
[im 97/290  soft-tissue]
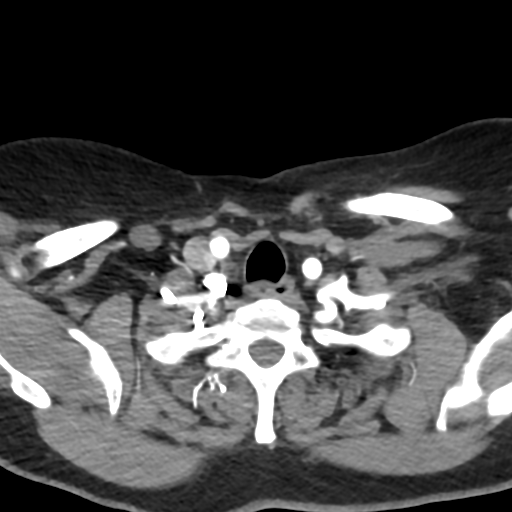
[im 129/290  bone]
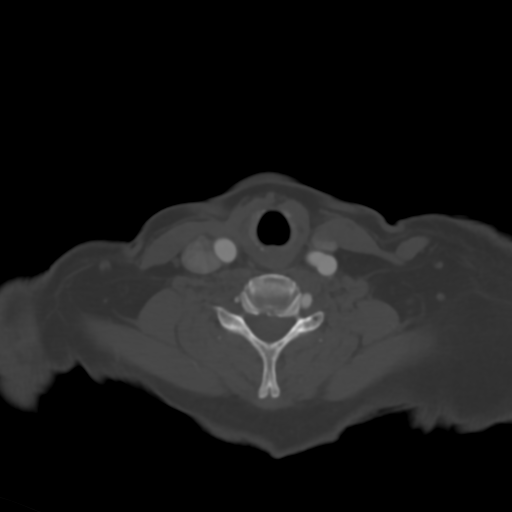
[im 161/290  soft-tissue]
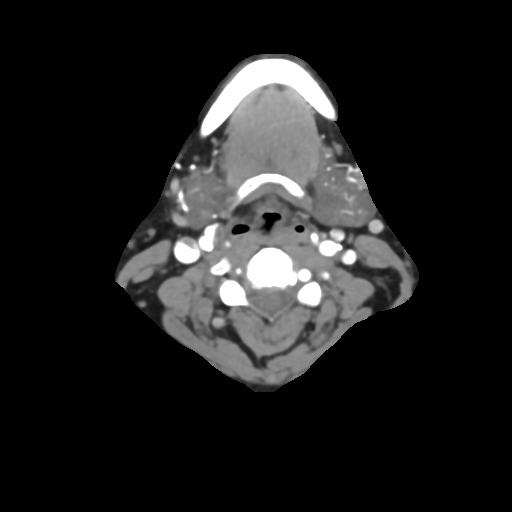
[im 193/290  bone]
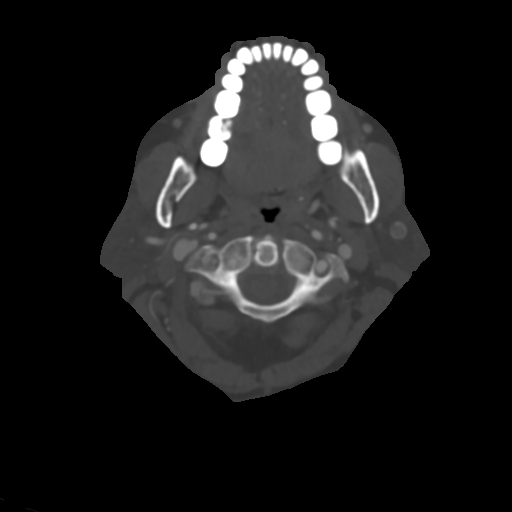
[im 225/290  soft-tissue]
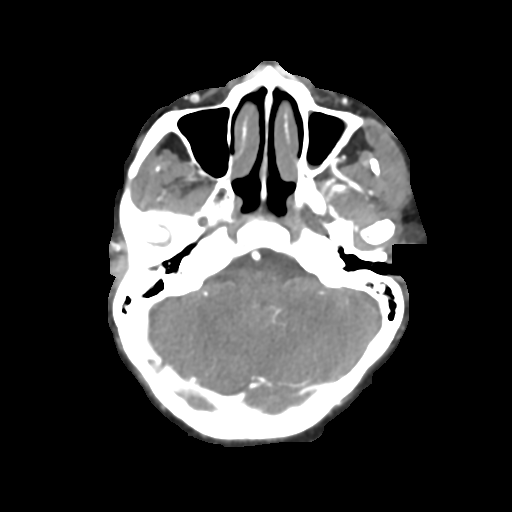
[im 257/290  bone]
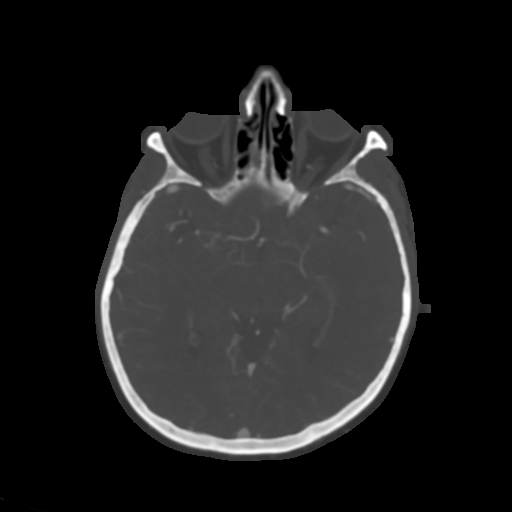

[8 of 33 positions shown; findings below may reference images not displayed]

FINDINGS: The bilateral common to common carotid, right esophageal, bypass graft remains widely patent. The very tiny linear intraluminal filling defect seen in the right aspect of the graft looks
unchanged and does not appear to be flow-limiting.
Aortic arch: The 90+ percent brachiocephalic artery origin calcified stenosis again present.
Vertebral arteries: The proximal portion of the right vertebral is obscured by beam hardening artifact from venous contrast. The visualized portion of right vertebral is very tiny although looks
contiguous up to the basilar artery level. The left vertebral artery is the dominant vessel and is widely patent.
Right common carotid arteriogram: The right carotid bulb, bifurcation, and proximal most portion of the right ICA has densely calcified plaquing with up to 50% diameter narrowing which constitutes a
70% cross-sectional stenosis. Limited views intracranial circulation shows a stable 60% cavernous carotid stenosis from calcified plaquing.
Left common carotid arteriogram: The left common carotid, left internal and the left external carotid arteries are also patent.
Visualized portion of the sinuses: The visualized portion of the sinuses are unremarkable.
Thyroid and the salivary glands: The left thyroid mass is been resected with no right thyroid tissue visible either..
Both lung apices are unremarkable.
2-D and 3-D reconstructive images confirmed the above findings.
IMPRESSION: Patent, and the common carotid bypass graft. 70% stenosing right carotid bulb calcified plaquing.
